# Patient Record
Sex: Female | Born: 1985 | Race: White | Hispanic: No | Marital: Single | Smoking: Former smoker
Health system: Southern US, Community
[De-identification: ages and names within clinical notes are randomized; demographics above are authoritative.]

## PROBLEM LIST (undated history)

## (undated) DIAGNOSIS — F4310 Post-traumatic stress disorder, unspecified: Secondary | ICD-10-CM

## (undated) DIAGNOSIS — F909 Attention-deficit hyperactivity disorder, unspecified type: Secondary | ICD-10-CM

## (undated) DIAGNOSIS — R2990 Unspecified symptoms and signs involving the nervous system: Secondary | ICD-10-CM

## (undated) DIAGNOSIS — G43909 Migraine, unspecified, not intractable, without status migrainosus: Secondary | ICD-10-CM

## (undated) DIAGNOSIS — F419 Anxiety disorder, unspecified: Secondary | ICD-10-CM

## (undated) DIAGNOSIS — I10 Essential (primary) hypertension: Secondary | ICD-10-CM

## (undated) DIAGNOSIS — K219 Gastro-esophageal reflux disease without esophagitis: Secondary | ICD-10-CM

## (undated) DIAGNOSIS — G8929 Other chronic pain: Secondary | ICD-10-CM

## (undated) DIAGNOSIS — F988 Other specified behavioral and emotional disorders with onset usually occurring in childhood and adolescence: Secondary | ICD-10-CM

## (undated) DIAGNOSIS — M5416 Radiculopathy, lumbar region: Secondary | ICD-10-CM

## (undated) HISTORY — DX: Gastro-esophageal reflux disease without esophagitis: K21.9

## (undated) HISTORY — PX: WISDOM TOOTH EXTRACTION: SHX21

## (undated) HISTORY — DX: Essential (primary) hypertension: I10

## (undated) HISTORY — DX: Post-traumatic stress disorder, unspecified: F43.10

## (undated) HISTORY — DX: Unspecified symptoms and signs involving the nervous system: R29.90

## (undated) HISTORY — DX: Radiculopathy, lumbar region: M54.16

## (undated) HISTORY — DX: Other specified behavioral and emotional disorders with onset usually occurring in childhood and adolescence: F98.8

## (undated) HISTORY — DX: Migraine, unspecified, not intractable, without status migrainosus: G43.909

## (undated) HISTORY — DX: Anxiety disorder, unspecified: F41.9

---
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
## 2009-11-22 ENCOUNTER — Emergency Department (HOSPITAL_COMMUNITY)
Admission: EM | Admit: 2009-11-22 | Discharge: 2009-11-22 | Payer: Self-pay | Source: Home / Self Care | Admitting: Family Medicine

## 2009-12-27 ENCOUNTER — Ambulatory Visit: Payer: Self-pay | Admitting: Oncology

## 2010-02-03 ENCOUNTER — Ambulatory Visit: Payer: Self-pay | Admitting: Oncology

## 2010-04-07 ENCOUNTER — Ambulatory Visit: Payer: Self-pay | Admitting: Oncology

## 2010-08-14 HISTORY — PX: TONSILLECTOMY: SUR1361

## 2010-09-03 ENCOUNTER — Encounter: Payer: Self-pay | Admitting: Family Medicine

## 2010-12-30 NOTE — Discharge Summary (Signed)
NAME:  Beth Holt, Beth Holt                       ACCOUNT NO.:  [id number]   MEDICAL RECORD NO.:  [id number]                   PATIENT TYPE:  INP   LOCATION:  0105                                 FACILITY:  [facility]   PHYSICIAN:  Beverly Milch, MD                  DATE OF BIRTH:  04/12/1986   DATE OF ADMISSION:  05/28/2003  DATE OF DISCHARGE:  06/01/2003                                 DISCHARGE SUMMARY   IDENTIFICATION:  This 25 year old female, ninth grade student at [employer], was admitted emergently by [facility] from [facility] Emergency Room, where the emergency room staff and ACT team had  responded to Dr. Tresea Mall referral for inpatient stabilization of  suicide risk with refusal to contract for safety.  The patient was highly  ambivalent and labile and presented obvious expectation that her maladaptive  ways be reinforced by whatever confinement was given.  The patient and  mother demanded release shortly after arrival because the patient did not  like the structure or containment.  For full details, please see the typed  history and physical.   HISTORY OF PRESENT ILLNESS:  Mother and patient gradually clarify that the  patient has been monitored and treated for ADHD symptoms intermittently much  of her academic life.  The patient has also been labile in relationships  with sometimes variant mannerisms.  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  However,  she subsequently encountered social conflicts such that mother is  considering home-schooling her again this year as she did last year.  The  patient reports a 20-pound weight gain but denies binge-eating or other such  origin.  She does not appear likely objectively to have gained 20 pounds,  particularly as she remains quite thin.  The patient has been on Prozac 40  mg daily and Strattera 80 mg daily but mother request that  medications be  discontinued at the time of admission and does discuss that Wellbutrin has  been considered as an alternative by Dr. Karie Georges.  The patient overdosed  October 8th or October 9th, apparently with Prozac and Naprosyn and did not  present with care until the day of admission to the emergency room.  She  reports numerous physical complaints such as hypoglycemia, causing her to  pass out with exercise or nearly do so, TMJ syndrome, irregular menses and  weight gain.  The patient continues to maintain that she is fine with  parental divorce when she was in late __________ years or early latency.  At  that time, there was significant financial hardships and mother had cancer  then.  Mother can outlined that the patient's social difficulties in school  as well as her tendency to hold everything inside and not talk out problem  resolution started then.   INITIAL MENTAL STATUS  EXAM:  The patient was labile with splitting,  distortion or denial without empathy or remorse.  She changes her mind every  few seconds but does not exhibit definite manic shifts even though she is  silly and regressive at times.  She does have atypical and hysteroid  dysphoric features.  She is moderately dysphoric, though she becomes  severely dysphoric for brief periods and at other times is smiling and  stating nothing is wrong but she does not elevate her mood beyond euthymia.  The patient reports hearing voices in a joking way but then states that it  is a ringing noise like a phone.  She would not be more clear.  She refuses  to contract for safety and continues to have suicidal ideation following her  suicide attempt.  She asked to be put to sleep.   LABORATORY DATA:  At [facility] Emergency Room, the CBC was normal except  white count low at 44,600 with reference range 4800-12,000.  Hemoglobin was  normal at 12.6, MCV at 86 and platelet count 307,000.  Urine pregnancy test  was negative.  Urine drug  screen was negative.  Basic metabolic panel was  normal with sodium 138, potassium 3.8, glucose 90, creatinine 0.7 and total  calcium 9.4.  No further laboratory testing was undertaken as the patient  and mother were demanding discharge initially at the time of arrival and  subsequently 12 hours later.   HOSPITAL COURSE AND TREATMENT:  Rest of step-wise intervention and  confrontation of the maladaptive problem-solving by the patient that mother  becomes overwhelmed with but also enables was undertaken.  Mother  participated at 8 a.m. the morning after admission for such an intervention.  By actively clarifying symptoms and maladaptive ways of sustaining them  rather than changing them, the patient and mother became able gradually to  allow the patient to remain in the treatment process.  Every effort was made  to not reinforce the patient's somewhat egosyntonic acting out and  destructive ways by the inclusion in the treatment program.  Such was  successful with patient and mother by the time of discharge, though the  patient had little insight into the process.  However, she was opening up  and talking more including with staff, peers and program as well as with  mother.  General medical evaluation by Vic Ripper, P.A.-C. noted the  TMJ, muscle spasm and headache symptoms and overdose last Saturday.  The  patient's Flexeril was not continued at this time and, during her hospital  stay, minocycline was not given particularly as the patient was ambivalent  about any medication.  The patient was started on Wellbutrin and received  150 mg XL every morning for the first three days.  On the day of discharge,  the 150 mg XL was given again with plan to titrate up to 300 mg the  following day.  The patient tolerated the medication well.  She had some  exaggerated mannerisms in periorbital phase that were not definite tics. Mother indicated that she had had such mannerisms, particularly  when talking  about stressful subjects all of her life.  She did not note exaggeration  with stimulants in the past so that no such exacerbation is necessarily  expected with Wellbutrin.  However, the patient and mother were educated on  this.  The patient's admission weight was 122.5 pounds with blood pressure  127/92 and heart rate 81.  The patient reported some hand-washing rituals in  the past.  Her weight at the time of discharge was 122 pounds with blood  pressure 122/75, heart rate 80, sitting, and blood pressure 119/83 with  heart rate of 100, standing.  The patient's suicidal ideation remitted and  she was able to establish contract for safety as well as crisis and safety  plans.  She participated in all aspects of active inpatient treatment,  though her insight remained marginal and she was still prone to validating  her maladaptive symptoms as though somewhat ego dystonic.  However, she was  able to address her sense of identity diffusion and confusion and the  origins for such in early life.  She did not manifest definite pervasive  developmental disorder and a personality disorder could not be consolidated  diagnostically at this time, though she is at significant risk for such.  Mother was able to identify a journal of a 23 year old female peer at school  who may have been stressing the patient by having parents that did not care  about him and having such morbid preoccupations and drawings.  The patient  and mother worked through these associations and the expectation that the  patient disengage.  The patient and mother were capable of participating in  therapy by the time of discharge for ongoing aftercare.  She is safe for  discharge and continued titration of the Wellbutrin to the targeted dose of  300 mg XL daily.   FINAL DIAGNOSES:   AXIS I:  1. 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  2. 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  3. History of attention-deficit hyperactivity disorder, predominantly     inattentive-type.  4. Parent-child problem.  5. Other specified family circumstances.  6. Other interpersonal problems.   AXIS II:  Rule out personality disorder not otherwise specified (provisional  diagnosis).   AXIS III:  1. History of hypoglycemia.  2. Temporomandibular joint syndrome.  3. Irregular menses.  4. Borderline glucopenia.   AXIS IV:  Stressors:  Family--severe, acute and chronic; school, phase of  life and peer relations--moderate, predominantly acute and chronic.   AXIS V:  Global Assessment of Functioning at the time of admission 40; with  highest in the last year 74 and discharge Global Assessment of Functioning  54.   PLAN:  The patient was discharged to mother in improved condition.  She is  more capable of participating in aftercare therapies.  She remains a characterologic risk for continued conflicts and consequences, though she is  encouraged to remain in public school and to work out the problems step by  step rather than withdrawing.  The patient is educated on medications as is  mother.  She is prescribed Wellbutrin XL every morning; quantity #30 with  one refill prescribed.  Strattera and Prozac have been discontinued.  She  will see Dr. Kieth Brightly June 03, 2003 at 15:00 and Dr. Minerva Areola July 01, 2003 at 14:15 at [facility] in [place].  Crisis and safety  plans are established if needed.                                                Beverly Milch, MD    GJ/MEDQ  D:  06/01/2003  T:  06/02/2003  Job:  [id number]   cc:   [facility]  [id number]  [place], [place] [address]

## 2010-12-30 NOTE — H&P (Signed)
NAME:  Beth Holt, Beth Holt                       ACCOUNT NO.:  [id number]   MEDICAL RECORD NO.:  [id number]                   PATIENT TYPE:  INP   LOCATION:  0105                                 FACILITY:  [facility]   PHYSICIAN:  Beverly Milch, MD                  DATE OF BIRTH:  12-04-1985   DATE OF ADMISSION:  05/28/2003  DATE OF DISCHARGE:                         PSYCHIATRIC ADMISSION ASSESSMENT   PATIENT IDENTIFICATION:  This 25 year old female ninth grade student at  [employer] is admitted emergently in transfer from [facility] Emergency Room for inpatient stabilization of suicide risk and  depression.  The patient presented to the emergency room in a highly  confusing fashion, eliciting from the staff the sense that she is helpless  and should be fully believed and taken care of while making statements that  are contradictory in a splitting fashion.  As an example, the patient asked  the emergency room staff to put her to sleep to kill her.  She would  contract for safety and stated that she had overdosed May 23, 2003.  The  patient had recently been on Prozac 40 mg daily and Strattera 80 mg daily  from [facility]. Halm, D.O., and Dr. Milford Cage referred her to the emergency room  stating that with her symptoms continuing to multiply and undergo  morphological changes that she will need specialized care.  After arrival to  the [facility], the patient demands to be discharged and  mother initially responds in an enabling fashion.  Mother clarifies that  their relationship is built around losses mutually experienced when parents  divorced, there were severe financial limitations, and mother had cancer in  the late oedipal and early latency years for the patient.  Mother states  that life is better now but the patient is not as though she is continuing  to grieve or rework these past losses in a destructive way rather than a  successful way.   HISTORY OF  PRESENT ILLNESS:  Mother suggests that the patient was somewhat  unusual including even in early elementary school.  She indicates the  patient never would get close to teachers to talk in ways that would allow  them to help her.  The patient had difficulty academically in school as well  as socially.  However, the patient did not come to profession attention for  her difficulties apparently until several months ago.  Mother suggests that  for the last three months, the patient has been much more destructive in her  acting out and in her interpersonal conflicts.  The patient has been defiant  of mother's rules; for instance, having a group of female friends take her to  grandmother's house instead of mother's house such that mother was very  worried about her.  The patient seems to be becoming much more somatic.  She  states she has TMJ syndrome, which needs  Naprosyn and Flexeril and plans to  see an oral surgeon next week while she is also having hypoglycemia and is  tired all the time.  The patient reports a weight gain of 20 pounds but  denies binge eating or purging.  She reports sleeping eight to 10 hours  nightly.  She has had several menses which were late or did not occur at all  this summer.  The patient presents repeated psychiatric complaints as well  such as hearing voices and having mood swings.  The patient does not seem to  reply consistently in her descriptions but rather seems to change  frequently.  Mother notes that the patient never will get to the bottom of  their conflicts and seems to have things she is depressed about at times  that mother cannot understand or the patient will not say.  Mother has  stopped the patient's medications, having learned that the patient overdosed  October 8 or May 23, 2003, apparently with Prozac and Naprosyn.  The  patient was medically cleared at [facility] Room.  The evaluating  therapist in the emergency room had worked with  me to clarify that the  patient's splitting and distortion in treatment would be better served in  outpatient treatment if she would contract for safety.  The patient would  not contract for safety and was sent to the [facility] by [facility].  The patient's overdose was unwitnessed and no additional confirmation  is possible with the patient not telling anyone for several days.  The  patient concludes that her head changes day to day as she avoids resolving  immediate or past conflict by generating displaced conflict.  The patient  continues to do so about suicidal ideation, clarifying when she states she  wants to go home that has no suicidal ideation and that she has resolved  these playful problems while at other times stating that she is suicidal.   PAST MEDICAL HISTORY:  The patient reportedly has hypoglycemia and TMJ  syndrome.  Her blood pressure was elevated at the time of admission at  127/92.  The patient reports that her menses have been irregular.  She does  not acknowledge sexual activity.  She suggests she has some repetitive hand  washing at times.  She does report feeling tired all the time.  She suggests  she has gained 20 pounds but she looks thin.  The patient reports that her  skin is sensitive and that she is near sighted.  The patient denies seizure  or syncope.  She denies heart murmur or arrhythmia.  She has had no known  organic central nervous system trauma.  She does not use drugs or alcohol.  She has no medication allergies.   REVIEW OF SYSTEMS:  The patient denies difficulty with gait, gaze, or  countenance.  She denies exposure to communicable disease or toxins.  She  denies rash, jaundice, or purpura.  She denies chest pain, palpitations, or  presyncope.  She denies abdominal pain, nausea, vomiting, or diarrhea.  She  has no dysuria or arthralgia.  At [facility] ER, she had a CBC, urine pregnancy test, urine drug screen, basic metabolic panel,  and blood alcohol  level, all of which were negative.   Immunizations are up-to-date through [facility]. Halm, D.O.   PHYSICAL EXAMINATION:  VITAL SIGNS:  Weight is 122.5 pounds and height is 65  inches with blood pressure 127/92 and heart rate 81.  NEUROLOGIC:  The patient is alert and oriented with speech intact.  Reflexes  and AMRs are 0/0.  There are no abnormal involuntarily movements.  There are  no neurologic soft signs.  Gait and gaze are intact.   SOCIAL AND DEVELOPMENTAL HISTORY:  Mother suggests the patient has had  difficulty since starting school.  She suggests the patient has always been  hesitant to open up and talk about her problems.  Mother does not describe  eccentric or odd mannerisms or relatedness but rather the patient was  avoiding the approach of others.  Mother perceives that the patient did  improve over time, becoming able to do her own work in school academically  and being more socially involved.  The patient was a cheerleader apparently  but she was being teased significantly by others including practical jokes.  Mother held her out of school for six months of the last school years.  Mother has now returned her to public school.  The patient is now  decompensating in other ways.  Mother seems to describe that the patient  does have the capacity for social relatedness and socialization.  She does  not outline a definite PDD or attachment disorder.  However, she does  describe unresolved intrapsychic conflicts that the patient will not discuss  with either mother or myself today.  After parents divorced, they had  significant financial problems.  Mother then had cancer as well.  Although  mother is doing better now and states the patient has what she needs and  plenty of support, the patient is starting to worry about other things.  The  patient does not acknowledge using cigarettes, alcohol, or illicit drugs.  She does not acknowledge sexual activity.    FAMILY HISTORY:  The patient and mother do not provide much elaboration on  the cause, course, or consequences of parental divorce.  The patient will  always say to mother that she is fine with the divorce though mother  suspects the patient has been hurt in some way in the past.  Mother denies  that there is any family history of major psychiatric disorder but  acknowledges that father is currently somewhat uninvolved in the patient's  current daily problems.   MENTAL STATUS EXAM:  The patient is labile and addresses confrontation by  splitting, distortion, and denial without empathy or remorse.  The patient  will gradually acknowledge that she is angry, especially about mother now  requiring treatment when she does not want it.  The patient thinks that she  should decide but she changes her mind every few seconds.  The patient is  highly inconsistent.  Mother states the patient is inattentive from ADHD and apparently this did not become fully apparent until late junior high.  The  patient is irritable and controlling.  She is moderately dysphoric, becoming  severely dysphoric at times though at other times, she smiles and states  that nothing is wrong.  The patient states she hears voices in a joking way.  She hears a ringing noise at times that sounds like a phone but she tries to  answer it and cannot find it.  Misperceptions are not of the psychotic type.  She is not homicidal or assaultive.  However, she has reported a suicide  attempt as well as reporting unwillingness to contract for safety.  She  expects others to just accept this but intervention with the patient and  mother can establish in mother the ability to state that dying  is  unacceptable.   ADMISSION DIAGNOSES:   AXIS I:  1. 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  2. 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  3. History of attention-deficit hyperactivity  disorder, predominantly     inattentive type.  4. Parent-child problem.  5. Other interpersonal problems.  6. Other specified family circumstances.   AXIS II:  Diagnosis deferred.   AXIS III:  1. History of hypoglycemia.  2. History of temporomandibular joint function.  3. Irregular menses.   AXIS IV:  Stressors: Family- severe, predominantly acute and chronic;  school, phase of life, and peer relations- moderate, predominantly acute and  chronic.   AXIS V:  Current global assessment of functioning 40 with highest global  assessment of functioning in the last year 74.   ASSETS AND STRENGTHS:  The patient is capable of doing well in school, at  least for intervals of time.   INITIAL PLAN OF CARE:  The patient and mother agree to three to four days of  inpatient treatment.  We structure that keeping suicide risk is not an  acceptable answer and that the patient will have to work out some accurate  understanding of her problems and some pragmatic solutions including to stay  alive and to cope.  Character disorder is certainly suspect and the patient  presents some symptoms egocentonic but then will cry and decompensate in a  depressive fashion.  She and mother request treatment for a chemical  imbalance and establish that Francoise Schaumann. Halm, D.O., had considered Wellbutrin  in place of Strattera and Prozac even at the time of his referral to the  emergency room.  Cognitive behavioral and family therapy are undertaken.  Containment of splitting, distortion, and denial are essential for progress  and treatment to begin.   ESTIMATED LENGTH OF STAY:  Three to four days.   CONDITIONS NECESSARY FOR DISCHARGE:  Target symptoms for discharge include  stabilization of suicide risk, stabilization of mood, and commitment to  resolving instead of sustaining and exacerbating her current maladaptive  coping and problems.                                               Beverly Milch,  MD   GJ/MEDQ  D:  05/29/2003  T:  05/29/2003  Job:  [id number]

## 2011-01-24 ENCOUNTER — Other Ambulatory Visit (HOSPITAL_COMMUNITY): Payer: Self-pay | Admitting: Family Medicine

## 2011-01-24 DIAGNOSIS — K7689 Other specified diseases of liver: Secondary | ICD-10-CM

## 2011-01-24 DIAGNOSIS — R932 Abnormal findings on diagnostic imaging of liver and biliary tract: Secondary | ICD-10-CM

## 2011-01-25 ENCOUNTER — Other Ambulatory Visit (HOSPITAL_COMMUNITY): Payer: Self-pay | Admitting: *Deleted

## 2011-01-25 ENCOUNTER — Ambulatory Visit (HOSPITAL_COMMUNITY)
Admission: RE | Admit: 2011-01-25 | Discharge: 2011-01-25 | Disposition: A | Discharge status: Home / Self Care | Payer: BC Managed Care – PPO | Source: Ambulatory Visit | Attending: Family Medicine | Admitting: Family Medicine

## 2011-01-25 ENCOUNTER — Other Ambulatory Visit (HOSPITAL_COMMUNITY): Payer: Self-pay | Admitting: Obstetrics & Gynecology

## 2011-01-25 DIAGNOSIS — Z139 Encounter for screening, unspecified: Secondary | ICD-10-CM

## 2011-01-25 DIAGNOSIS — R932 Abnormal findings on diagnostic imaging of liver and biliary tract: Secondary | ICD-10-CM

## 2011-01-25 DIAGNOSIS — R109 Unspecified abdominal pain: Secondary | ICD-10-CM | POA: Insufficient documentation

## 2011-01-25 DIAGNOSIS — Z1231 Encounter for screening mammogram for malignant neoplasm of breast: Secondary | ICD-10-CM | POA: Insufficient documentation

## 2011-01-25 DIAGNOSIS — K7689 Other specified diseases of liver: Secondary | ICD-10-CM

## 2011-02-14 ENCOUNTER — Encounter: Payer: Self-pay | Admitting: Gastroenterology

## 2011-12-29 ENCOUNTER — Other Ambulatory Visit (HOSPITAL_COMMUNITY): Payer: Self-pay | Admitting: Family Medicine

## 2011-12-29 DIAGNOSIS — Z139 Encounter for screening, unspecified: Secondary | ICD-10-CM

## 2012-01-29 ENCOUNTER — Ambulatory Visit (HOSPITAL_COMMUNITY)
Admission: RE | Admit: 2012-01-29 | Discharge: 2012-01-29 | Disposition: A | Discharge status: Home / Self Care | Payer: Managed Care, Other (non HMO) | Source: Ambulatory Visit | Attending: Family Medicine | Admitting: Family Medicine

## 2012-01-29 DIAGNOSIS — Z1231 Encounter for screening mammogram for malignant neoplasm of breast: Secondary | ICD-10-CM | POA: Insufficient documentation

## 2012-01-29 DIAGNOSIS — Z139 Encounter for screening, unspecified: Secondary | ICD-10-CM

## 2012-10-28 ENCOUNTER — Ambulatory Visit (INDEPENDENT_AMBULATORY_CARE_PROVIDER_SITE_OTHER): Payer: Managed Care, Other (non HMO) | Admitting: Gastroenterology

## 2012-10-28 ENCOUNTER — Other Ambulatory Visit: Payer: Self-pay | Admitting: Gastroenterology

## 2012-10-28 ENCOUNTER — Encounter: Payer: Self-pay | Admitting: Gastroenterology

## 2012-10-28 VITALS — BP 120/73 | HR 83 | Temp 98.4°F | Ht 67.0 in | Wt 132.8 lb

## 2012-10-28 DIAGNOSIS — R141 Gas pain: Secondary | ICD-10-CM

## 2012-10-28 DIAGNOSIS — R197 Diarrhea, unspecified: Secondary | ICD-10-CM

## 2012-10-28 DIAGNOSIS — K219 Gastro-esophageal reflux disease without esophagitis: Secondary | ICD-10-CM

## 2012-10-28 DIAGNOSIS — R140 Abdominal distension (gaseous): Secondary | ICD-10-CM | POA: Insufficient documentation

## 2012-10-28 LAB — CBC WITH DIFFERENTIAL/PLATELET
Basophils Absolute: 0.1 10*3/uL (ref 0.0–0.1)
Basophils Relative: 2 % — ABNORMAL HIGH (ref 0–1)
Eosinophils Absolute: 0.3 10*3/uL (ref 0.0–0.7)
Eosinophils Relative: 6 % — ABNORMAL HIGH (ref 0–5)
HCT: 41.7 % (ref 36.0–46.0)
Hemoglobin: 13.9 g/dL (ref 12.0–15.0)
Lymphocytes Relative: 31 % (ref 12–46)
Lymphs Abs: 1.6 10*3/uL (ref 0.7–4.0)
MCH: 27.5 pg (ref 26.0–34.0)
MCHC: 33.3 g/dL (ref 30.0–36.0)
MCV: 82.6 fL (ref 78.0–100.0)
Monocytes Absolute: 0.5 10*3/uL (ref 0.1–1.0)
Monocytes Relative: 9 % (ref 3–12)
Neutro Abs: 2.7 10*3/uL (ref 1.7–7.7)
Neutrophils Relative %: 52 % (ref 43–77)
Platelets: 318 10*3/uL (ref 150–400)
RBC: 5.05 MIL/uL (ref 3.87–5.11)
RDW: 13.6 % (ref 11.5–15.5)
WBC: 5.2 10*3/uL (ref 4.0–10.5)

## 2012-10-28 MED ORDER — PEG 3350-KCL-NA BICARB-NACL 420 G PO SOLR: 4000.0000 mL | ORAL | Status: DC

## 2012-10-28 MED ORDER — PANTOPRAZOLE SODIUM 40 MG PO TBEC: 40.0000 mg | DELAYED_RELEASE_TABLET | Freq: Every day | ORAL | Status: DC

## 2012-10-28 NOTE — Assessment & Plan Note (Signed)
Start Protonix daily. Prescription provided. EGD due to melena.

## 2012-10-28 NOTE — Assessment & Plan Note (Signed)
Associated with eating, chronic. Celiac serologies as planned. TCS/EGD.

## 2012-10-28 NOTE — Patient Instructions (Addendum)
Please have blood work completed.   Start taking Protonix (for reflux) each morning 30 minutes before breakfast.  We have given you samples of Restora, which is a probiotic. Take this daily. You can also find other brands such as Align, Digestive Advantage, [employer]'s Colon Health.  We have scheduled you for a colonoscopy and upper endoscopy with Dr. Jena Gauss in the near future. Further recommendations to follow.

## 2012-10-28 NOTE — Progress Notes (Signed)
Primary Care Physician:  Morrow, Aaron P, MD Primary Gastroenterologist:  Dr. Rourk   Chief Complaint  Patient presents with  . Diarrhea  . Bloated    hurts in lower back  . Gas    HPI:   [name] is a very pleasant 27-year-old female who presents today as a self-referral. She notes a history of lactose intolerance and GI problems since childhood. States she bloats severely with eating, "looks 5 months pregnant". Gas is hard to control. At times has noted fecal incontinence.   Notes tarry stools, bright red blood. Noted about a month ago, 3 separate occasions. Stools can be very mucus like. Discharge-like. Notes loose stools every time she pees. 5-7 bowel movements a day. Sometimes has the urge but can't go. Diarrhea first thing in morning. Notes lower back feels pressure/pain/constant. No regular bowel movements.  +abdominal pain with bloating. States abdomen will be very distended. No N/V. No NSAIDs or aspirin powders. Last year woke up choking from nocturnal reflux. Happened 5-6 times. Notes heartburn intermittently. Will burp it up then swallow.   Tried to follow a gluten-free diet and noticed improvement. +postprandial component. Denies significant weight loss. States weight fluctuates often.   Past Medical History  Diagnosis Date  . GERD (gastroesophageal reflux disease)   . ADD (attention deficit disorder)   . Migraines   . Hypertension   . Anxiety   . PTSD (post-traumatic stress disorder)     Past Surgical History  Procedure Laterality Date  . Tonsillectomy  2012  . Wisdom tooth extraction      Current Outpatient Prescriptions  Medication Sig Dispense Refill  . ADDERALL XR 20 MG 24 hr capsule Take 20 mg by mouth every morning.       . norgestimate-ethinyl estradiol (ORTHO-CYCLEN,SPRINTEC,PREVIFEM) 0.25-35 MG-MCG tablet Take 1 tablet by mouth daily.       . propranolol (INDERAL) 10 MG tablet Take 10 mg by mouth 2 (two) times daily.       . sertraline  (ZOLOFT) 100 MG tablet Take 100 mg by mouth daily.       . topiramate (TOPAMAX) 50 MG tablet Take 50 mg by mouth daily.       . pantoprazole (PROTONIX) 40 MG tablet Take 1 tablet (40 mg total) by mouth daily.  30 tablet  3  . polyethylene glycol-electrolytes (TRILYTE) 420 G solution Take 4,000 mLs by mouth as directed.  4000 mL  0   No current facility-administered medications for this visit.    Allergies as of 10/28/2012 - Review Complete 10/28/2012  Allergen Reaction Noted  . Lactose intolerance (gi)  10/28/2012    Family History  Problem Relation Age of Onset  . Colon cancer Neg Hx   . Breast cancer Mother     History   Social History  . Marital Status: Single    Spouse Name: N/A    Number of Children: N/A  . 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 Smoking status: Current Every Day Smoker -- 0.50 packs/day for 5 years  . Smokeless tobacco: Not on file  . Alcohol Use: No  . Drug Use: No  . Sexually Active: Yes    Birth Control/ Protection: OCP   Other Topics Concern  . Not on file   Social History Narrative  . No narrative on file    Review of Systems: Gen: Denies any fever, chills,   fatigue, weight loss, lack of appetite.  CV: Denies chest pain, heart palpitations, peripheral edema, syncope.   Resp: Denies shortness of breath at rest or with exertion. Denies wheezing or cough.  GI: SEE HPI GU : Denies urinary burning, urinary frequency, urinary hesitancy MS: Denies joint pain, muscle weakness, cramps, or limitation of movement.  Derm: Denies rash, itching, dry skin Psych: Denies depression, anxiety, memory loss, and confusion Heme: Denies bruising, bleeding, and enlarged lymph nodes.  Physical Exam: BP 120/73  Pulse 83  Temp(Src) 98.4 F (36.9 C) (Oral)  Ht 5' 7" (1.702 m)  Wt 132 lb 12.8 oz (60.238 kg)  BMI 20.79 kg/m2  LMP 10/22/2012 General:   Alert and oriented. Pleasant and  cooperative. Well-nourished and well-developed.  Head:  Normocephalic and atraumatic. Eyes:  Without icterus, sclera clear and conjunctiva pink.  Ears:  Normal auditory acuity. Nose:  No deformity, discharge,  or lesions. Mouth:  No deformity or lesions, oral mucosa pink.  Neck:  Supple, without mass or thyromegaly. Lungs:  Clear to auscultation bilaterally. No wheezes, rales, or rhonchi. No distress.  Heart:  S1, S2 present without murmurs appreciated.  Abdomen:  +BS, soft, TTP lower abdomen and non-distended. No HSM noted. No guarding or rebound. No masses appreciated.  Rectal:  Deferred  Msk:  Symmetrical without gross deformities. Normal posture. Extremities:  Without clubbing or edema. Neurologic:  Alert and  oriented x4;  grossly normal neurologically. Skin:  Intact without significant lesions or rashes. Cervical Nodes:  No significant cervical adenopathy. Psych:  Alert and cooperative. Normal mood and affect.    

## 2012-10-28 NOTE — Assessment & Plan Note (Signed)
27 year old female with a history of chronic loose stools, abdominal bloating, occasional incontinence. Notes loose stools every time she urinates. Scant hematochezia noted on several prior episodes; however, she also reports melena recently. Mucus-like discharge noted. Postprandial component as well, with abdominal discomfort. No lower GI evaluation has been undertaken before. She notes some improvement with a trial of gluten-free diet. Concern for celiac disease, IBD, IBS. She desires to hold off on trial of Bentyl currently.   Proceed with TCS/EGD with Dr. Jena Gauss in near future: the risks, benefits, and alternatives have been discussed with the patient in detail. The patient states understanding and desires to proceed. TTG, IgA and IgA TSH CBC

## 2012-10-29 LAB — IGA: IgA: 108 mg/dL (ref 69–380)

## 2012-10-29 LAB — TSH: TSH: 1.169 u[IU]/mL (ref 0.350–4.500)

## 2012-10-29 LAB — TISSUE TRANSGLUTAMINASE, IGA: Tissue Transglutaminase Ab, IgA: 3 U/mL (ref <20)

## 2012-10-30 ENCOUNTER — Encounter (HOSPITAL_COMMUNITY): Payer: Self-pay | Admitting: Pharmacy Technician

## 2012-10-30 ENCOUNTER — Telehealth: Payer: Self-pay | Admitting: Gastroenterology

## 2012-10-30 LAB — HCG, SERUM, QUALITATIVE: Preg, Serum: NEGATIVE

## 2012-10-30 NOTE — Telephone Encounter (Signed)
Called [facility], added HCG qualitative- (per Micca this gives the positive/negative results)

## 2012-10-30 NOTE — Progress Notes (Signed)
Faxed to PCP

## 2012-10-30 NOTE — Telephone Encounter (Signed)
Can we add on a pregnancy test to blood already in lab?  If not, needs urine pregnancy test. Procedure tomorrow.

## 2012-10-30 NOTE — Telephone Encounter (Signed)
Noted. Thanks.

## 2012-10-31 ENCOUNTER — Encounter (HOSPITAL_COMMUNITY): Payer: Self-pay | Admitting: *Deleted

## 2012-10-31 ENCOUNTER — Encounter (HOSPITAL_COMMUNITY)
Admission: RE | Disposition: A | Discharge status: Home / Self Care | Payer: Self-pay | Source: Ambulatory Visit | Attending: Internal Medicine

## 2012-10-31 ENCOUNTER — Ambulatory Visit (HOSPITAL_COMMUNITY)
Admission: RE | Admit: 2012-10-31 | Discharge: 2012-10-31 | Disposition: A | Discharge status: Home / Self Care | Payer: Managed Care, Other (non HMO) | Source: Ambulatory Visit | Attending: Internal Medicine | Admitting: Internal Medicine

## 2012-10-31 DIAGNOSIS — R141 Gas pain: Secondary | ICD-10-CM

## 2012-10-31 DIAGNOSIS — R143 Flatulence: Secondary | ICD-10-CM

## 2012-10-31 DIAGNOSIS — F17200 Nicotine dependence, unspecified, uncomplicated: Secondary | ICD-10-CM | POA: Insufficient documentation

## 2012-10-31 DIAGNOSIS — R142 Eructation: Secondary | ICD-10-CM

## 2012-10-31 DIAGNOSIS — G43909 Migraine, unspecified, not intractable, without status migrainosus: Secondary | ICD-10-CM | POA: Insufficient documentation

## 2012-10-31 DIAGNOSIS — K219 Gastro-esophageal reflux disease without esophagitis: Secondary | ICD-10-CM

## 2012-10-31 DIAGNOSIS — R140 Abdominal distension (gaseous): Secondary | ICD-10-CM

## 2012-10-31 DIAGNOSIS — F988 Other specified behavioral and emotional disorders with onset usually occurring in childhood and adolescence: Secondary | ICD-10-CM | POA: Insufficient documentation

## 2012-10-31 DIAGNOSIS — E739 Lactose intolerance, unspecified: Secondary | ICD-10-CM | POA: Insufficient documentation

## 2012-10-31 DIAGNOSIS — R197 Diarrhea, unspecified: Secondary | ICD-10-CM | POA: Insufficient documentation

## 2012-10-31 DIAGNOSIS — F4310 Post-traumatic stress disorder, unspecified: Secondary | ICD-10-CM | POA: Insufficient documentation

## 2012-10-31 DIAGNOSIS — Z79899 Other long term (current) drug therapy: Secondary | ICD-10-CM | POA: Insufficient documentation

## 2012-10-31 DIAGNOSIS — D721 Eosinophilia, unspecified: Secondary | ICD-10-CM

## 2012-10-31 DIAGNOSIS — I10 Essential (primary) hypertension: Secondary | ICD-10-CM | POA: Insufficient documentation

## 2012-10-31 HISTORY — PX: COLONOSCOPY WITH ESOPHAGOGASTRODUODENOSCOPY (EGD): SHX5779

## 2012-10-31 LAB — CLOSTRIDIUM DIFFICILE BY PCR: Toxigenic C. Difficile by PCR: NEGATIVE

## 2012-10-31 SURGERY — COLONOSCOPY WITH ESOPHAGOGASTRODUODENOSCOPY (EGD)
Anesthesia: Moderate Sedation

## 2012-10-31 MED ORDER — ONDANSETRON HCL 4 MG/2ML IJ SOLN
INTRAMUSCULAR | Status: DC | PRN
  Administered 2012-10-31: 4 mg via INTRAVENOUS

## 2012-10-31 MED ORDER — BUTAMBEN-TETRACAINE-BENZOCAINE 2-2-14 % EX AERO
INHALATION_SPRAY | CUTANEOUS | Status: DC | PRN
  Administered 2012-10-31: 2 via TOPICAL

## 2012-10-31 MED ORDER — ONDANSETRON HCL 4 MG/2ML IJ SOLN
INTRAMUSCULAR | Status: AC
  Filled 2012-10-31: qty 2

## 2012-10-31 MED ORDER — MIDAZOLAM HCL 5 MG/5ML IJ SOLN
INTRAMUSCULAR | Status: AC
  Filled 2012-10-31: qty 10

## 2012-10-31 MED ORDER — MEPERIDINE HCL 100 MG/ML IJ SOLN
INTRAMUSCULAR | Status: DC | PRN
  Administered 2012-10-31: 50 mg via INTRAVENOUS
  Administered 2012-10-31 (×2): 25 mg via INTRAVENOUS

## 2012-10-31 MED ORDER — MIDAZOLAM HCL 5 MG/5ML IJ SOLN
INTRAMUSCULAR | Status: DC | PRN
  Administered 2012-10-31: 1 mg via INTRAVENOUS
  Administered 2012-10-31 (×3): 2 mg via INTRAVENOUS

## 2012-10-31 MED ORDER — STERILE WATER FOR IRRIGATION IR SOLN
Status: DC | PRN
  Administered 2012-10-31: 10:00:00

## 2012-10-31 MED ORDER — SODIUM CHLORIDE 0.9 % IV SOLN
INTRAVENOUS | Status: DC
  Administered 2012-10-31: 1000 mL via INTRAVENOUS

## 2012-10-31 MED ORDER — MEPERIDINE HCL 100 MG/ML IJ SOLN
INTRAMUSCULAR | Status: AC
  Filled 2012-10-31: qty 2

## 2012-10-31 NOTE — Progress Notes (Signed)
Quick Note:  Negative pregnancy test.  Proceed with procedure as planned. ______

## 2012-10-31 NOTE — Op Note (Signed)
[facility] [address] [place], [address]   ENDOSCOPY PROCEDURE REPORT  PATIENT: Beth, Holt  MR#: [id number] BIRTHDATE: 1986/05/07 , 26  yrs. old GENDER: Female ENDOSCOPIST: R.  Roetta Sessions, MD FACP FACG REFERRED BY:  Darlina Rumpf, M.D. PROCEDURE DATE:  10/31/2012 PROCEDURE:     EGD with gastric and duodenal biopsy  INDICATIONS:     Bloating/diarrhea/elevated peripheral eosinophil count  INFORMED CONSENT:   The risks, benefits, limitations, alternatives and imponderables have been discussed.  The potential for biopsy, esophogeal dilation, etc. have also been reviewed.  Questions have been answered.  All parties agreeable.  Please see the history and physical in the medical record for more information.  MEDICATIONS:    Versed 5 mg IV and Demerol 100 mg IV in divided doses. Zofran 4 mg IV. Cetacaine spray.  DESCRIPTION OF PROCEDURE:   The Pentax Gastroscope [id number] endoscope was introduced through the mouth and advanced to the second portion of the duodenum without difficulty or limitations. The mucosal surfaces were surveyed very carefully during advancement of the scope and upon withdrawal.  Retroflexion view of the proximal stomach and esophagogastric junction was performed.      FINDINGS: Normal esophagus. Stomach empty. Normal-appearing gastric mucosa. Patent pylorus. Normal-appearing first second and third portion of the duodenum.  THERAPEUTIC / DIAGNOSTIC MANEUVERS PERFORMED:  Biopsies of the duodenal and gastric mucosa taken.   COMPLICATIONS:  None  IMPRESSION: Normal Esophagus, stomach and duodenum through the third portion-status post biopsy and described above.  RECOMMENDATIONS:  Followup on pathology. See colonoscopy report.    _______________________________ R. Roetta Sessions, MD FACP [facility] eSigned:  R. Roetta Sessions, MD FACP [facility] 10/31/2012 10:56 AM     CC:

## 2012-10-31 NOTE — H&P (View-Only) (Signed)
Primary Care Physician:  Laurell Josephs, MD Primary Gastroenterologist:  Dr. Jena Gauss   Chief Complaint  Patient presents with  . Diarrhea  . Bloated    hurts in lower back  . Gas    HPI:   [name] is a very pleasant 27 year old female who presents today as a self-referral. She notes a history of lactose intolerance and GI problems since childhood. States she bloats severely with eating, "looks 5 months pregnant". Gas is hard to control. At times has noted fecal incontinence.   Notes tarry stools, bright red blood. Noted about a month ago, 3 separate occasions. Stools can be very mucus like. Discharge-like. Notes loose stools every time she pees. 5-7 bowel movements a day. Sometimes has the urge but can't go. Diarrhea first thing in morning. Notes lower back feels pressure/pain/constant. No regular bowel movements.  +abdominal pain with bloating. States abdomen will be very distended. No N/V. No NSAIDs or aspirin powders. Last year woke up choking from nocturnal reflux. Happened 5-6 times. Notes heartburn intermittently. Will burp it up then swallow.   Tried to follow a gluten-free diet and noticed improvement. +postprandial component. Denies significant weight loss. States weight fluctuates often.   Past Medical History  Diagnosis Date  . GERD (gastroesophageal reflux disease)   . ADD (attention deficit disorder)   . Migraines   . Hypertension   . Anxiety   . PTSD (post-traumatic stress disorder)     Past Surgical History  Procedure Laterality Date  . Tonsillectomy  2012  . Wisdom tooth extraction      Current Outpatient Prescriptions  Medication Sig Dispense Refill  . ADDERALL XR 20 MG 24 hr capsule Take 20 mg by mouth every morning.       . norgestimate-ethinyl estradiol (ORTHO-CYCLEN,SPRINTEC,PREVIFEM) 0.25-35 MG-MCG tablet Take 1 tablet by mouth daily.       . propranolol (INDERAL) 10 MG tablet Take 10 mg by mouth 2 (two) times daily.       . sertraline  (ZOLOFT) 100 MG tablet Take 100 mg by mouth daily.       Marland Kitchen topiramate (TOPAMAX) 50 MG tablet Take 50 mg by mouth daily.       . pantoprazole (PROTONIX) 40 MG tablet Take 1 tablet (40 mg total) by mouth daily.  30 tablet  3  . polyethylene glycol-electrolytes (TRILYTE) 420 G solution Take 4,000 mLs by mouth as directed.  4000 mL  0   No current facility-administered medications for this visit.    Allergies as of 10/28/2012 - Review Complete 10/28/2012  Allergen Reaction Noted  . Lactose intolerance (gi)  10/28/2012    Family History  Problem Relation Age of Onset  . Colon cancer Neg Hx   . Breast cancer Mother     History   Social History  . Marital Status: Single    Spouse Name: N/A    Number of Children: N/A  . 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 Smoking status: Current Every Day Smoker -- 0.50 packs/day for 5 years  . Smokeless tobacco: Not on file  . Alcohol Use: No  . Drug Use: No  . Sexually Active: Yes    Birth Control/ Protection: OCP   Other Topics Concern  . Not on file   Social History Narrative  . No narrative on file    Review of Systems: Gen: Denies any fever, chills,  fatigue, weight loss, lack of appetite.  CV: Denies chest pain, heart palpitations, peripheral edema, syncope.   Resp: Denies shortness of breath at rest or with exertion. Denies wheezing or cough.  GI: SEE HPI GU : Denies urinary burning, urinary frequency, urinary hesitancy MS: Denies joint pain, muscle weakness, cramps, or limitation of movement.  Derm: Denies rash, itching, dry skin Psych: Denies depression, anxiety, memory loss, and confusion Heme: Denies bruising, bleeding, and enlarged lymph nodes.  Physical Exam: BP 120/73  Pulse 83  Temp(Src) 98.4 F (36.9 C) (Oral)  Ht 5\' 7"  (1.702 m)  Wt 132 lb 12.8 oz (60.238 kg)  BMI 20.79 kg/m2  LMP 10/22/2012 General:   Alert and oriented. Pleasant and  cooperative. Well-nourished and well-developed.  Head:  Normocephalic and atraumatic. Eyes:  Without icterus, sclera clear and conjunctiva pink.  Ears:  Normal auditory acuity. Nose:  No deformity, discharge,  or lesions. Mouth:  No deformity or lesions, oral mucosa pink.  Neck:  Supple, without mass or thyromegaly. Lungs:  Clear to auscultation bilaterally. No wheezes, rales, or rhonchi. No distress.  Heart:  S1, S2 present without murmurs appreciated.  Abdomen:  +BS, soft, TTP lower abdomen and non-distended. No HSM noted. No guarding or rebound. No masses appreciated.  Rectal:  Deferred  Msk:  Symmetrical without gross deformities. Normal posture. Extremities:  Without clubbing or edema. Neurologic:  Alert and  oriented x4;  grossly normal neurologically. Skin:  Intact without significant lesions or rashes. Cervical Nodes:  No significant cervical adenopathy. Psych:  Alert and cooperative. Normal mood and affect.

## 2012-10-31 NOTE — Interval H&P Note (Signed)
History and Physical Interval Note:  10/31/2012 10:30 AM  Beth Holt  has presented today for surgery, with the diagnosis of DIARRHEA, BLOATING AND GERD  The various methods of treatment have been discussed with the patient and family. After consideration of risks, benefits and other options for treatment, the patient has consented to  Procedure(s) with comments: COLONOSCOPY WITH ESOPHAGOGASTRODUODENOSCOPY (EGD) (N/A) - 9:45 as a surgical intervention .  The patient's history has been reviewed, patient examined, no change in status, stable for surgery.  I have reviewed the patient's chart and labs.  Questions were answered to the patient's satisfaction.   CBC okay except for slightly elevated eosinophils; serum IgA normal. Serum TTG not elevated. EGD and colonoscopy per plan.. The risks, benefits, limitations, imponderables and alternatives regarding both EGD and colonoscopy have been reviewed with the patient. Questions have been answered. All parties agreeable.   [name]

## 2012-10-31 NOTE — Progress Notes (Signed)
Quick Note:  Negative celiac serologies, Hgb normal, TSH normal. Proceed with TCS/EGD as planned for today, 3/20. ______

## 2012-10-31 NOTE — Op Note (Signed)
[facility] [address] [place], [address]   COLONOSCOPY PROCEDURE REPORT  PATIENT: Beth Holt, Beth Holt  MR#:         [id number] BIRTHDATE: 1986/06/06 , 26  yrs. old GENDER: Female ENDOSCOPIST: R.  Roetta Sessions, MD FACP FACG REFERRED BY:  Darlina Rumpf, M.D. PROCEDURE DATE:  10/31/2012 PROCEDURE:     Ileocolonoscopy with stool sampling and segmental biopsy  INDICATIONS: Chronic diarrhea; elevated peripheral eosinophil count  INFORMED CONSENT:  The risks, benefits, alternatives and imponderables including but not limited to bleeding, perforation as well as the possibility of a missed lesion have been reviewed.  The potential for biopsy, lesion removal, etc. have also been discussed.  Questions have been answered.  All parties agreeable. Please see the history and physical in the medical record for more information.  MEDICATIONS: Versed 7 mg IV and Demerol 100 mg IV in divided doses. Zofran 4 mg IV  DESCRIPTION OF PROCEDURE:  After a digital rectal exam was performed, the [id number] ([id number])  colonoscope was advanced from the anus through the rectum and colon to the area of the cecum, ileocecal valve and appendiceal orifice.  The cecum was deeply intubated.  These structures were well-seen and photographed for the record.  From the level of the cecum and ileocecal valve, the scope was slowly and cautiously withdrawn.  The mucosal surfaces were carefully surveyed utilizing scope tip deflection to facilitate fold flattening as needed.  The scope was pulled down into the rectum where a thorough examination including retroflexion was performed.    FINDINGS:  Adequate preparation. Normal rectum, colon and distal 10 cm of terminal ileum. colon,  THERAPEUTIC / DIAGNOSTIC MANEUVERS PERFORMED:  segmental biopsies of the ascending and sigmoid segments taken for histology. Stool sample was submitted to the lab as well. . COMPLICATIONS: None  CECAL WITHDRAWAL  TIME:  12 minutes  IMPRESSION:   Normal rectum,  colon and terminal ileum - -status post biopsy and stool sample  RECOMMENDATIONS: Follow up on pending studies. See EGD report.   _______________________________ eSigned:  R. Roetta Sessions, MD FACP [facility] 10/31/2012 11:34 AM   CC:

## 2012-11-01 LAB — FECAL LACTOFERRIN, QUANT: Fecal Lactoferrin: NEGATIVE

## 2012-11-01 LAB — GIARDIA/CRYPTOSPORIDIUM SCREEN(EIA)
Cryptosporidium Screen (EIA): NEGATIVE
Giardia Screen - EIA: NEGATIVE

## 2012-11-03 ENCOUNTER — Encounter: Payer: Self-pay | Admitting: Internal Medicine

## 2012-11-04 ENCOUNTER — Encounter (HOSPITAL_COMMUNITY): Payer: Self-pay | Admitting: Internal Medicine

## 2012-11-04 ENCOUNTER — Encounter: Payer: Self-pay | Admitting: *Deleted

## 2012-11-04 LAB — OVA AND PARASITE EXAMINATION: Ova and parasites: NONE SEEN

## 2012-11-04 LAB — STOOL CULTURE

## 2012-11-06 ENCOUNTER — Telehealth: Payer: Self-pay | Admitting: Gastroenterology

## 2012-11-06 ENCOUNTER — Other Ambulatory Visit: Payer: Self-pay | Admitting: Gastroenterology

## 2012-11-06 NOTE — Telephone Encounter (Signed)
Please let patient know she had normal EGD, TCS, biopsies. No celiac noted, normal stool studies. This is a good thing!  However, we need to find out what is causing her symptoms. May be dealing with IBS, functional gut disorder, etc.   Let's have her avoid dairy products, and set her up for a hydrogen breath test to assess for bacterial overgrowth.  Then, follow-up with me only (leslie has not seen her before).

## 2012-11-06 NOTE — Telephone Encounter (Signed)
Patient is scheduled for HBT on Monday 11/18/12 at 7:30 am and I have mailed her the instructions

## 2012-11-06 NOTE — Telephone Encounter (Signed)
Pt aware of results  Leigh-Ann can you set her up for hydrogen breath test  Thanks

## 2012-11-07 NOTE — Telephone Encounter (Signed)
Patient changed her HBT to Friday 04/11 at 7:30

## 2012-11-14 ENCOUNTER — Telehealth: Payer: Self-pay | Admitting: Internal Medicine

## 2012-11-14 NOTE — Telephone Encounter (Signed)
Per Vicky K. W/[employer] HBT does not require preautorization

## 2012-11-22 ENCOUNTER — Encounter (HOSPITAL_COMMUNITY)
Admission: RE | Disposition: A | Discharge status: Home / Self Care | Payer: Self-pay | Source: Ambulatory Visit | Attending: Internal Medicine

## 2012-11-22 ENCOUNTER — Ambulatory Visit (HOSPITAL_COMMUNITY)
Admission: RE | Admit: 2012-11-22 | Discharge: 2012-11-22 | Disposition: A | Discharge status: Home / Self Care | Payer: Managed Care, Other (non HMO) | Source: Ambulatory Visit | Attending: Internal Medicine | Admitting: Internal Medicine

## 2012-11-22 ENCOUNTER — Encounter (HOSPITAL_COMMUNITY): Payer: Self-pay

## 2012-11-22 DIAGNOSIS — R142 Eructation: Secondary | ICD-10-CM | POA: Insufficient documentation

## 2012-11-22 DIAGNOSIS — R141 Gas pain: Secondary | ICD-10-CM | POA: Insufficient documentation

## 2012-11-22 DIAGNOSIS — R197 Diarrhea, unspecified: Secondary | ICD-10-CM | POA: Insufficient documentation

## 2012-11-22 HISTORY — PX: BACTERIAL OVERGROWTH TEST: SHX5739

## 2012-11-22 SURGERY — BREATH TEST, FOR INTESTINAL BACTERIAL OVERGROWTH

## 2012-11-22 MED ORDER — LACTULOSE 10 GM/15ML PO SOLN
37.5000 g | Freq: Once | ORAL | Status: AC
  Administered 2012-11-22: 37.5 g via ORAL
  Filled 2012-11-22: qty 60

## 2012-11-22 MED ORDER — LACTULOSE 10 GM/15ML PO SOLN
ORAL | Status: AC
  Filled 2012-11-22: qty 60

## 2012-11-22 NOTE — Progress Notes (Signed)
No beans, bran or high fiber cereal the day before the procedure? no NPO except for water 12 hours before procedure? yes No smoking, sleeping or vigorous exercising for at least 30 before procedure? no Recent antibiotic use and/or diarrhea? Yes-pt has recent diarrhea r/t being lactose intolerant but no antibiotics   If yes, physician notified.  Time Baseline 15 mins 30 mins 45 mins 60 mins 75 mins 90 mins 105 mins 120 mins 135 mins 150 mins 165 mins 180 mins  H2-ppm 5 6 5 6 6 8 7 7 8 15 9 9  4

## 2012-11-25 ENCOUNTER — Encounter (HOSPITAL_COMMUNITY): Payer: Self-pay | Admitting: Internal Medicine

## 2012-11-26 DIAGNOSIS — R142 Eructation: Secondary | ICD-10-CM

## 2012-11-26 DIAGNOSIS — R197 Diarrhea, unspecified: Secondary | ICD-10-CM

## 2012-11-26 DIAGNOSIS — R109 Unspecified abdominal pain: Secondary | ICD-10-CM

## 2012-11-26 DIAGNOSIS — R141 Gas pain: Secondary | ICD-10-CM

## 2012-11-26 DIAGNOSIS — R143 Flatulence: Secondary | ICD-10-CM

## 2012-11-26 NOTE — Op Note (Signed)
Date of Procedure: November 22, 2012 Primary Gastroenterologist: Dr. Jena Gauss  Procedure: Hydrogen Breath Test  Indications: 27 year old pleasant female with history of chronic bloating, diarrhea, and abdominal pain. Recent upper endoscopy and colonoscopy both normal.   Pre-Procedure: No beans, bran, high fiber cereal in the past 24 hours. She has remained NPO for 12 hours. No smoking or vigorous exercise before the procedure. No recent antibiotic use.   Test Sugar: Lactulose  Findings: Initial breathalyzer at baseline read 5 parts per million. This remained overall consistent until at 135 minutes, where it was 15 parts per million. No significant peaks noted.  Assessment: Hydrogen breath test completed and without convincing evidence of small bowel bacterial overgrowth. Beth Holt notes lactose intolerance, and she likely has significant IBS as the culprit of her symptoms. Last abdominal imaging via CT noted in 2005. Will discuss patient's case further with Dr. Jena Gauss. May need further imaging, but treatment with anti-spasmodics, probiotic, and appropriate diet will be first-line therapy in the interim.     Time  Baseline  15 mins  30 mins  45 mins  60 mins  75 mins  90 mins  105 mins  120 mins  135 mins  150 mins  165 mins  180 mins   H2-ppm  5  6  5  6  6  8  7  7  8  15  9  9   4

## 2012-12-02 ENCOUNTER — Encounter: Payer: Self-pay | Admitting: Gastroenterology

## 2012-12-05 ENCOUNTER — Telehealth: Payer: Self-pay | Admitting: Gastroenterology

## 2012-12-05 NOTE — Telephone Encounter (Signed)
Hydrogen breath test appears negative. Please offer pt f/u visit with me, non-urgent. Thanks!

## 2012-12-09 NOTE — Telephone Encounter (Signed)
Tried to call pt- LMOM 

## 2012-12-11 NOTE — Telephone Encounter (Signed)
Pt is aware, she stated she would call back and make the appointment.

## 2013-09-08 ENCOUNTER — Encounter (HOSPITAL_COMMUNITY): Payer: Self-pay | Admitting: Emergency Medicine

## 2013-09-08 ENCOUNTER — Emergency Department (HOSPITAL_COMMUNITY): Payer: Managed Care, Other (non HMO)

## 2013-09-08 ENCOUNTER — Emergency Department (HOSPITAL_COMMUNITY)
Admission: EM | Admit: 2013-09-08 | Discharge: 2013-09-08 | Disposition: A | Discharge status: Other Acute Inpatient Hospital | Payer: Managed Care, Other (non HMO) | Attending: Emergency Medicine | Admitting: Emergency Medicine

## 2013-09-08 DIAGNOSIS — Z91040 Latex allergy status: Secondary | ICD-10-CM | POA: Insufficient documentation

## 2013-09-08 DIAGNOSIS — G43909 Migraine, unspecified, not intractable, without status migrainosus: Secondary | ICD-10-CM | POA: Insufficient documentation

## 2013-09-08 DIAGNOSIS — T888XXA Other specified complications of surgical and medical care, not elsewhere classified, initial encounter: Secondary | ICD-10-CM

## 2013-09-08 DIAGNOSIS — R5381 Other malaise: Secondary | ICD-10-CM | POA: Insufficient documentation

## 2013-09-08 DIAGNOSIS — F411 Generalized anxiety disorder: Secondary | ICD-10-CM | POA: Insufficient documentation

## 2013-09-08 DIAGNOSIS — Z8719 Personal history of other diseases of the digestive system: Secondary | ICD-10-CM | POA: Insufficient documentation

## 2013-09-08 DIAGNOSIS — R109 Unspecified abdominal pain: Secondary | ICD-10-CM | POA: Insufficient documentation

## 2013-09-08 DIAGNOSIS — F4310 Post-traumatic stress disorder, unspecified: Secondary | ICD-10-CM | POA: Insufficient documentation

## 2013-09-08 DIAGNOSIS — F17200 Nicotine dependence, unspecified, uncomplicated: Secondary | ICD-10-CM | POA: Insufficient documentation

## 2013-09-08 DIAGNOSIS — K623 Rectal prolapse: Secondary | ICD-10-CM

## 2013-09-08 DIAGNOSIS — R112 Nausea with vomiting, unspecified: Secondary | ICD-10-CM | POA: Insufficient documentation

## 2013-09-08 DIAGNOSIS — M79609 Pain in unspecified limb: Secondary | ICD-10-CM | POA: Insufficient documentation

## 2013-09-08 DIAGNOSIS — L03319 Cellulitis of trunk, unspecified: Secondary | ICD-10-CM

## 2013-09-08 DIAGNOSIS — R509 Fever, unspecified: Secondary | ICD-10-CM | POA: Insufficient documentation

## 2013-09-08 DIAGNOSIS — IMO0002 Reserved for concepts with insufficient information to code with codable children: Secondary | ICD-10-CM | POA: Insufficient documentation

## 2013-09-08 DIAGNOSIS — I10 Essential (primary) hypertension: Secondary | ICD-10-CM | POA: Insufficient documentation

## 2013-09-08 DIAGNOSIS — L02219 Cutaneous abscess of trunk, unspecified: Secondary | ICD-10-CM | POA: Insufficient documentation

## 2013-09-08 DIAGNOSIS — Y838 Other surgical procedures as the cause of abnormal reaction of the patient, or of later complication, without mention of misadventure at the time of the procedure: Secondary | ICD-10-CM | POA: Insufficient documentation

## 2013-09-08 DIAGNOSIS — L0291 Cutaneous abscess, unspecified: Secondary | ICD-10-CM

## 2013-09-08 DIAGNOSIS — R5383 Other fatigue: Secondary | ICD-10-CM

## 2013-09-08 LAB — CBC WITH DIFFERENTIAL/PLATELET
Basophils Absolute: 0.1 10*3/uL (ref 0.0–0.1)
Basophils Relative: 1 % (ref 0–1)
Eosinophils Absolute: 0.5 10*3/uL (ref 0.0–0.7)
Eosinophils Relative: 5 % (ref 0–5)
HCT: 34.5 % — ABNORMAL LOW (ref 36.0–46.0)
Hemoglobin: 11.7 g/dL — ABNORMAL LOW (ref 12.0–15.0)
Lymphocytes Relative: 20 % (ref 12–46)
Lymphs Abs: 2.1 10*3/uL (ref 0.7–4.0)
MCH: 30 pg (ref 26.0–34.0)
MCHC: 33.9 g/dL (ref 30.0–36.0)
MCV: 88.5 fL (ref 78.0–100.0)
Monocytes Absolute: 0.4 10*3/uL (ref 0.1–1.0)
Monocytes Relative: 4 % (ref 3–12)
Neutro Abs: 7.1 10*3/uL (ref 1.7–7.7)
Neutrophils Relative %: 70 % (ref 43–77)
Platelets: 353 10*3/uL (ref 150–400)
RBC: 3.9 MIL/uL (ref 3.87–5.11)
RDW: 12.1 % (ref 11.5–15.5)
WBC: 10.1 10*3/uL (ref 4.0–10.5)

## 2013-09-08 LAB — COMPREHENSIVE METABOLIC PANEL
ALT: 11 U/L (ref 0–35)
AST: 14 U/L (ref 0–37)
Albumin: 3.3 g/dL — ABNORMAL LOW (ref 3.5–5.2)
Alkaline Phosphatase: 63 U/L (ref 39–117)
BUN: 13 mg/dL (ref 6–23)
CO2: 27 mEq/L (ref 19–32)
Calcium: 9 mg/dL (ref 8.4–10.5)
Chloride: 100 mEq/L (ref 96–112)
Creatinine, Ser: 0.79 mg/dL (ref 0.50–1.10)
GFR calc Af Amer: >90 mL/min (ref >90)
GFR calc non Af Amer: >90 mL/min (ref >90)
Glucose, Bld: 120 mg/dL — ABNORMAL HIGH (ref 70–99)
Potassium: 3.9 mEq/L (ref 3.7–5.3)
Sodium: 140 mEq/L (ref 137–147)
Total Bilirubin: 0.3 mg/dL (ref 0.3–1.2)
Total Protein: 7.1 g/dL (ref 6.0–8.3)

## 2013-09-08 LAB — LIPASE, BLOOD: Lipase: 14 U/L (ref 11–59)

## 2013-09-08 MED ORDER — HYDROMORPHONE HCL PF 1 MG/ML IJ SOLN
0.5000 mg | Freq: Once | INTRAMUSCULAR | Status: AC
  Administered 2013-09-08: 0.5 mg via INTRAVENOUS
  Filled 2013-09-08: qty 1

## 2013-09-08 MED ORDER — ONDANSETRON HCL 4 MG/2ML IJ SOLN
4.0000 mg | Freq: Once | INTRAMUSCULAR | Status: AC
  Administered 2013-09-08: 4 mg via INTRAVENOUS
  Filled 2013-09-08: qty 2

## 2013-09-08 MED ORDER — SODIUM CHLORIDE 0.9 % IV BOLUS (SEPSIS)
1000.0000 mL | Freq: Once | INTRAVENOUS | Status: AC
  Administered 2013-09-08: 1000 mL via INTRAVENOUS

## 2013-09-08 MED ORDER — IOHEXOL 300 MG/ML  SOLN
100.0000 mL | Freq: Once | INTRAMUSCULAR | Status: AC | PRN
  Administered 2013-09-08: 100 mL via INTRAVENOUS

## 2013-09-08 MED ORDER — IOHEXOL 300 MG/ML  SOLN
25.0000 mL | Freq: Once | INTRAMUSCULAR | Status: AC | PRN
  Administered 2013-09-08: 25 mL via ORAL

## 2013-09-08 MED ORDER — HYDROMORPHONE HCL PF 1 MG/ML IJ SOLN
1.0000 mg | Freq: Once | INTRAMUSCULAR | Status: AC
  Administered 2013-09-08: 1 mg via INTRAVENOUS
  Filled 2013-09-08: qty 1

## 2013-09-08 MED ORDER — SODIUM CHLORIDE 0.9 % IV SOLN
INTRAVENOUS | Status: DC
  Administered 2013-09-08: 18:00:00 via INTRAVENOUS

## 2013-09-08 NOTE — ED Notes (Signed)
Initial Contact - pt to RM7 with visitor, changed to hospital gown.  Pt reports rectal prolapse surgery x2 weeks ago at [facility].  Pt reports c/o low back pain 7/10 and intermittent fevers x5 days.  Pt is well appearing, ambulating without assistance. A+Ox4.  Skin PWD.  Speaking full/clear sentences, rr even/un-lab.  Abd s/nt/nd, pt denies n/v/d/c.  Pt denies b/b changes or complaints.  Pt denies n/t to extremities, neuros grossly intact.  NAD.  Awaiting EDP eval.

## 2013-09-08 NOTE — ED Notes (Signed)
Pt to move to main ED for further workup

## 2013-09-08 NOTE — ED Notes (Signed)
Pt to CT at this time.

## 2013-09-08 NOTE — ED Notes (Signed)
[facility]

## 2013-09-08 NOTE — ED Provider Notes (Signed)
CSN: [id number]     Arrival date & time 09/08/13  1307 History   First MD Initiated Contact with Patient 09/08/13 1417     Chief Complaint  Patient presents with  . Back Pain   (Consider location/radiation/quality/duration/timing/severity/associated sxs/prior Treatment) The history is provided by the patient. No language interpreter was used.  Felix AhmadiCaroline L Mcclish is a 28 y/o F with PMHx of GERD, ADD, migraines, HTN, anxiety, PTSD presenting to the ED with back pain that has been ongoing since Wednesday. Patient recently had rectal prolapse surgery performed on 08/26/2013 by Dr. Abbe AmsterdamHopkins at [facility] in [place], [place]. Reported that the surgery went well, was 6 hours long - denied any direct complications after the surgery. Patient reported that the back pain is a sharp shooting pain that is constant - radiating down the legs bilaterally. Reported that the pain radiates to her abdomen as well. Stated that she has been having difficulty with defecation - stated that her stools have been very difficult. Reported that her last BM was today while in the ED, but stated that it was small - stated that she has been drinking prune juice and taking colace. Stated that she has been having pelvic pain as well. Stated that she has pain when sitting down, laying, down, going from a sitting to a standing position - reported that she has been walking around in order to relieve the pain. Reported that she has been having weakness in her legs. Stated that she called the nurse at the hospital who recommended that patient to go to Urgent care - stated that she went to urgent care and they reported that this is a nerve issue. Patient stated that she called the nurse again today due to continued pain who recommended her to come to the ED. Patient reported that her next appointment with Dr. Abbe AmsterdamHopkins is on 09/17/2013. Stated that she been using neurontin and dilaudid with minimal relief. Reported intermittent fever episodes with  at least 5 episodes of emesis since her surgery. Denied chest pain, shortness of breath, difficulty breathing, numbness, tingling, loss of sensation.  PCP Dr. Kateri PlummerMorrow  Past Medical History  Diagnosis Date  . GERD (gastroesophageal reflux disease)   . ADD (attention deficit disorder)   . Migraines   . Hypertension   . Anxiety   . PTSD (post-traumatic stress disorder)    Past Surgical History  Procedure Laterality Date  . Tonsillectomy  2012  . Wisdom tooth extraction    . Colonoscopy with esophagogastroduodenoscopy (egd) N/A 10/31/2012    Procedure: COLONOSCOPY WITH ESOPHAGOGASTRODUODENOSCOPY (EGD);  Surgeon: Corbin Adeobert M Rourk, MD;  [facility]: [facility];  Service: Endoscopy;  Laterality: N/A;  9:45  . Bacterial overgrowth test N/A 11/22/2012    Procedure: BACTERIAL OVERGROWTH TEST;  Surgeon: Corbin Adeobert M Rourk, MD;  Location: [facility];  Service: Endoscopy;  Laterality: N/A;  7:30   Family History  Problem Relation Age of Onset  . Colon cancer Neg Hx   . Breast cancer Mother    History  Substance Use Topics  . Smoking status: Current Every Day Smoker -- 0.50 packs/day for 5 years  . Smokeless tobacco: Not on file  . Alcohol Use: No   OB History   Grav Para Term Preterm Abortions TAB SAB Ect Mult Living                 Review of Systems  Constitutional: Positive for fever (subjective). Negative for chills.  Respiratory: Negative for chest tightness and shortness  of breath.   Cardiovascular: Negative for chest pain.  Gastrointestinal: Positive for nausea, vomiting and abdominal pain. Negative for diarrhea, blood in stool and anal bleeding.  Genitourinary: Negative for dysuria and decreased urine volume.  Neurological: Positive for weakness. Negative for dizziness.  All other systems reviewed and are negative.    Allergies  Aspirin; Lactose intolerance (gi); Latex; Morphine and related; and Tylenol  Home Medications   Current Outpatient Rx  Name  Route  Sig  Dispense   Refill  . drospirenone-ethinyl estradiol (YASMIN 28) 3-0.03 MG tablet   Oral   Take 1 tablet by mouth daily.         Marland Kitchen gabapentin (NEURONTIN) 300 MG capsule   Oral   Take 300 mg by mouth 3 (three) times daily.         Marland Kitchen HYDROmorphone (DILAUDID) 2 MG tablet   Oral   Take 2 mg by mouth every 4 (four) hours as needed for severe pain.         Marland Kitchen lamoTRIgine (LAMICTAL) 25 MG tablet   Oral   Take 50 mg by mouth daily.         . QUEtiapine (SEROQUEL) 200 MG tablet   Oral   Take 400 mg by mouth at bedtime.          BP 121/71  Pulse 86  Temp(Src) 98.5 F (36.9 C) (Oral)  Resp 20  SpO2 100%  LMP 08/25/2013 Physical Exam  Nursing note and vitals reviewed. Constitutional: She is oriented to person, place, and time. She appears well-developed and well-nourished. No distress.  Patient sitting comfortably on bed  HENT:  Head: Normocephalic and atraumatic.  Mouth/Throat: Oropharynx is clear and moist. No oropharyngeal exudate.  Eyes: Conjunctivae and EOM are normal. Pupils are equal, round, and reactive to light. Right eye exhibits no discharge. Left eye exhibits no discharge.  Neck: Normal range of motion. Neck supple. No tracheal deviation present.  Cardiovascular: Normal rate, regular rhythm and normal heart sounds.  Exam reveals no friction rub.   No murmur heard. Pulses:      Radial pulses are 2+ on the right side, and 2+ on the left side.       Dorsalis pedis pulses are 2+ on the right side, and 2+ on the left side.  Pulmonary/Chest: Effort normal and breath sounds normal. No respiratory distress. She has no wheezes. She has no rales.  Abdominal: Soft. Bowel sounds are normal. There is tenderness. There is no guarding.    Positive abdominal swelling noted to the lower quadrants Discomfort upon palpation and auscultation to the lower abdominal region.   Genitourinary:  Anus with negative swelling, erythema, inflammation, lesions, sores, bleeding, discharge, negative  prolapse noted.   Musculoskeletal: Normal range of motion.       Back:  Negative swelling, erythema, inflammation, lesions, sores, bulging, deformities noted to the cervical/thoracic/lumbar/coccyx of the spine. Discomfort upon palpation to the lumbosacral region of the spine - mid-spinal and paraspinal bilaterally.  Full ROM to upper and lower extremities without difficulty noted, negative ataxia noted.  Lymphadenopathy:    She has no cervical adenopathy.  Neurological: She is alert and oriented to person, place, and time. No cranial nerve deficit. She exhibits normal muscle tone. Coordination normal.  Cranial nerves III-XII grossly intact Strength 5+/5+ to upper and lower extremities bilaterally with resistance applied, equal distribution noted Sensation intact to upper and lower extremities bilaterally Gait proper, proper balance - negative sway, negative drift, negative step-offs  Skin: Skin  is warm and dry. No rash noted. She is not diaphoretic. No erythema.  Psychiatric: She has a normal mood and affect. Her behavior is normal. Thought content normal.    ED Course  Procedures (including critical care time)  4:16 PM This provider spoke with Dr. Jonny Ruiz, colorectal surgeon from [facility]. Discussed case, history, presentation of patient. As per physician - agreed with blood work and recommended CT scan of abdomen and pelvis to be performed with contrast. If all okay and pain under control - patient can be discharged and call his office for follow-up sooner than 09/17/2013.   8:01 AM This provider spoke with Dr. Joellyn Quails regarding and imaging results on the CT scan. Due to not improved pain control - patient to be transferred to [facility] to be admitted to the hospital for pain control and possible drainage from the site. CT on CD and labs given to patient to bring with her to the ED.   8:29 PM This provider spoke with Dr. Clelia Croft from [facility] ED - discussed  plan of transfer to the ED and that Dr. Jonny Ruiz is aware of the patient's transfer.   Discussed plan for transfer with patient. Gave the patient the option of driving to the hospital or [facility] - patient opted for [facility] to transfer her.   Results for orders placed during the hospital encounter of 09/08/13  CBC WITH DIFFERENTIAL      Result Value Range   WBC 10.1  4.0 - 10.5 K/uL   RBC 3.90  3.87 - 5.11 MIL/uL   Hemoglobin 11.7 (*) 12.0 - 15.0 g/dL   HCT 16.1 (*) 09.6 - 04.5 %   MCV 88.5  78.0 - 100.0 fL   MCH 30.0  26.0 - 34.0 pg   MCHC 33.9  30.0 - 36.0 g/dL   RDW 40.9  81.1 - 91.4 %   Platelets 353  150 - 400 K/uL   Neutrophils Relative % 70  43 - 77 %   Neutro Abs 7.1  1.7 - 7.7 K/uL   Lymphocytes Relative 20  12 - 46 %   Lymphs Abs 2.1  0.7 - 4.0 K/uL   Monocytes Relative 4  3 - 12 %   Monocytes Absolute 0.4  0.1 - 1.0 K/uL   Eosinophils Relative 5  0 - 5 %   Eosinophils Absolute 0.5  0.0 - 0.7 K/uL   Basophils Relative 1  0 - 1 %   Basophils Absolute 0.1  0.0 - 0.1 K/uL  COMPREHENSIVE METABOLIC PANEL      Result Value Range   Sodium 140  137 - 147 mEq/L   Potassium 3.9  3.7 - 5.3 mEq/L   Chloride 100  96 - 112 mEq/L   CO2 27  19 - 32 mEq/L   Glucose, Bld 120 (*) 70 - 99 mg/dL   BUN 13  6 - 23 mg/dL   Creatinine, Ser 7.82  0.50 - 1.10 mg/dL   Calcium 9.0  8.4 - 95.6 mg/dL   Total Protein 7.1  6.0 - 8.3 g/dL   Albumin 3.3 (*) 3.5 - 5.2 g/dL   AST 14  0 - 37 U/L   ALT 11  0 - 35 U/L   Alkaline Phosphatase 63  39 - 117 U/L   Total Bilirubin 0.3  0.3 - 1.2 mg/dL   GFR calc non Af Amer >90  >90 mL/min   GFR calc Af Amer >90  >90 mL/min  LIPASE, BLOOD  Result Value Range   Lipase 14  11 - 59 U/L   Ct Abdomen Pelvis W Contrast  09/08/2013   CLINICAL DATA:  Prior rectal prolapse surgery.  Back pain.  EXAM: CT ABDOMEN AND PELVIS WITH CONTRAST  TECHNIQUE: Multidetector CT imaging of the abdomen and pelvis was performed using the standard protocol following bolus  administration of intravenous contrast.  CONTRAST:  25mL OMNIPAQUE IOHEXOL 300 MG/ML SOLN, OMNIPAQUE IOHEXOL 300 MG/ML SOLN  COMPARISON:  US ABDOMEN COMPLETE dated 01/25/2011; CT ABDOMEN W/CM dated 07/27/2004  FINDINGS: There is a 5.5 x 4.7 cm enhancing mass with central lucencies in the periphery of the right lobe of the liver. This is new from prior CT of 07/27/2004. Although this could represent a large developing benign hepatic lesion such as adenoma, focal nodular hyperplasia, or hemangioma given that this lesion is new, a primary hepatic malignancy or metastasis should be considered. Given central lucency present in the lesion a process such as a fibrolamellar hepatocellular carcinoma should be considered. No other focal hepatic abnormality is identified. The hepatic veins are patent. Portal vein is patent. Spleen is normal. Splenic vein is patent. Pancreas is normal. No biliary distention. The gallbladder appear normal.  Adrenals normal. Simple 10 mm cyst left kidney, midpole anterior. Innumerable punctate lucencies are noted throughout the remainder of the left kidney, too small to accurately characterize. No obstructing ureteral stone. The bladder is nondistended. Uterus and adnexa are unremarkable.  Shotty inguinal lymph nodes are noted. Shotty retroperitoneal lymph nodes noted. Abdominal aorta is widely patent. No aneurysm. The visceral vessels are patent.  Appendix normal. Large amount of stool noted throughout the colon suggesting constipation. No evidence of small bowel distention. Stomach is nondistended. No free air. No significant mesenteric abnormalities identified. No hernia. A 4.8 x 1.2 cm fluid collection in the presacral space. This may be related to patient's recent surgery. This could represent a seroma/ hematoma. Developing abscess cannot be excluded. No air is noted within the lesion. Follow-up scan is suggested to demonstrate clearing.  Heart size normal. Mild atelectasis versus  scarring left lung base. No acute bony abnormality identified. No lytic or sclerotic lesions identified.  IMPRESSION: 1. 5.4 x 4.7 cm enhancing mass with central lucencies in the periphery of the right lobe of the liver. This is new from prior CT of 07/27/2004. This suspicious for hepatic malignancy including primary hepatic malignancy or large metastasis. Given central lucency present in this lesion, a process such as a fibrolamellar hepatocellular carcinoma should be considered. MRI the liver may prove views further evaluation.  2. A 4.8 x 1.2 cm presacral fluid collection is present. This may be related to the patient's recent surgery. This could represent a seroma/hematoma. A developing abscess cannot be excluded. Follow-up scan is suggested to demonstrate clearing.  3.  Constipation.   Electronically Signed   By: Maisie Fus  Register   On: 09/08/2013 19:22    Labs Review Labs Reviewed  CBC WITH DIFFERENTIAL - Abnormal; Notable for the following:    Hemoglobin 11.7 (*)    HCT 34.5 (*)    All other components within normal limits  COMPREHENSIVE METABOLIC PANEL - Abnormal; Notable for the following:    Glucose, Bld 120 (*)    Albumin 3.3 (*)    All other components within normal limits  LIPASE, BLOOD   Imaging Review Ct Abdomen Pelvis W Contrast  09/08/2013   CLINICAL DATA:  Prior rectal prolapse surgery.  Back pain.  EXAM: CT ABDOMEN AND PELVIS  WITH CONTRAST  TECHNIQUE: Multidetector CT imaging of the abdomen and pelvis was performed using the standard protocol following bolus administration of intravenous contrast.  CONTRAST:  25mL OMNIPAQUE IOHEXOL 300 MG/ML SOLN, OMNIPAQUE IOHEXOL 300 MG/ML SOLN  COMPARISON:  US ABDOMEN COMPLETE dated 01/25/2011; CT ABDOMEN W/CM dated 07/27/2004  FINDINGS: There is a 5.5 x 4.7 cm enhancing mass with central lucencies in the periphery of the right lobe of the liver. This is new from prior CT of 07/27/2004. Although this could represent a large developing  benign hepatic lesion such as adenoma, focal nodular hyperplasia, or hemangioma given that this lesion is new, a primary hepatic malignancy or metastasis should be considered. Given central lucency present in the lesion a process such as a fibrolamellar hepatocellular carcinoma should be considered. No other focal hepatic abnormality is identified. The hepatic veins are patent. Portal vein is patent. Spleen is normal. Splenic vein is patent. Pancreas is normal. No biliary distention. The gallbladder appear normal.  Adrenals normal. Simple 10 mm cyst left kidney, midpole anterior. Innumerable punctate lucencies are noted throughout the remainder of the left kidney, too small to accurately characterize. No obstructing ureteral stone. The bladder is nondistended. Uterus and adnexa are unremarkable.  Shotty inguinal lymph nodes are noted. Shotty retroperitoneal lymph nodes noted. Abdominal aorta is widely patent. No aneurysm. The visceral vessels are patent.  Appendix normal. Large amount of stool noted throughout the colon suggesting constipation. No evidence of small bowel distention. Stomach is nondistended. No free air. No significant mesenteric abnormalities identified. No hernia. A 4.8 x 1.2 cm fluid collection in the presacral space. This may be related to patient's recent surgery. This could represent a seroma/ hematoma. Developing abscess cannot be excluded. No air is noted within the lesion. Follow-up scan is suggested to demonstrate clearing.  Heart size normal. Mild atelectasis versus scarring left lung base. No acute bony abnormality identified. No lytic or sclerotic lesions identified.  IMPRESSION: 1. 5.4 x 4.7 cm enhancing mass with central lucencies in the periphery of the right lobe of the liver. This is new from prior CT of 07/27/2004. This suspicious for hepatic malignancy including primary hepatic malignancy or large metastasis. Given central lucency present in this lesion, a process such as a  fibrolamellar hepatocellular carcinoma should be considered. MRI the liver may prove views further evaluation.  2. A 4.8 x 1.2 cm presacral fluid collection is present. This may be related to the patient's recent surgery. This could represent a seroma/hematoma. A developing abscess cannot be excluded. Follow-up scan is suggested to demonstrate clearing.  3.  Constipation.   Electronically Signed   By: Maisie Fus  Register   On: 09/08/2013 19:22    EKG Interpretation   None       MDM   1. Fluid collection at surgical site   2. Abscess   3. Rectal prolapse     Medications  0.9 %  sodium chloride infusion ( Intravenous New Bag/Given 09/08/13 1758)  HYDROmorphone (DILAUDID) injection 1 mg (not administered)  ondansetron (ZOFRAN) injection 4 mg (not administered)  sodium chloride 0.9 % bolus 1,000 mL (0 mLs Intravenous Stopped 09/08/13 1743)  HYDROmorphone (DILAUDID) injection 0.5 mg (0.5 mg Intravenous Given 09/08/13 1611)  ondansetron (ZOFRAN) injection 4 mg (4 mg Intravenous Given 09/08/13 1611)  iohexol (OMNIPAQUE) 300 MG/ML solution 25 mL (25 mLs Oral Contrast Given 09/08/13 1720)  ondansetron (ZOFRAN) injection 4 mg (4 mg Intravenous Given 09/08/13 1758)  HYDROmorphone (DILAUDID) injection 1 mg (1 mg Intravenous Given  09/08/13 1758)  iohexol (OMNIPAQUE) 300 MG/ML solution 100 mL (100 mLs Intravenous Contrast Given 09/08/13 1814)    Filed Vitals:   09/08/13 1447 09/08/13 1804  BP: 125/89 121/71  Pulse: 105 86  Temp: 98.2 F (36.8 C) 98.5 F (36.9 C)  TempSrc:  Oral  Resp: 20   SpO2: 100% 100%    Patient presenting to the ED with back pain that started on Wednesday described as a constant sharp, shooting pain localized to the lower back with radiation to the legs and abdomen. Reported that she has been taking dilaudid and gabapentin as prescribed with mild degradation of the pain. Stated that has been having at least one BM per day with normal appetite.  Alert and oriented. GCS 15. Heart  rate and rhythm normal. Lungs clear to auscultation. Radial and DP 2+ bilaterally. Full ROM to upper and lower extremities bilaterally. BS normoactive in all 4 quadrants. Mild swelling noted to lower quadrants with pain upon palpation. Incision sites noted with negative findings of infection/cellulitic findings. Negative LAD. Negative deformities noted to the spine. Discomfort upon palpation to the lumbosacral/coccyx of the spine - mid-spinal and paraspinal region. Full ROM noted to the lower extremities without difficulty. Sensation intact. Strength intact. Negative focal neurological deficits identified. This provider spoke with colorectal surgeon, Dr. Romeo Apple Hopkins-recommended CT abdomen and pelvis with contrast to be performed. Recommended pain controlled in ED setting. Reported that if CT abdomen and pelvis was negative and pain controlled in ED patient is cleared for discharge and for patient to call his office tomorrow to be reassessed by the end of this week. CBC negative elevation white blood cell count noted. CMP negative findings. Lipase negative elevation. CT abdomen and pelvis noted 5.4 x 4.7 cm enhancing mass with central lucencies in the periphery of the right lobe of the liver - suspicion high for hepatic malignancy including primary hepatic malignancy or large metastasis - cannot rule out fibrolamellar hepatocellular carcinoma. 4.8 x 1.2 cm presacral fluid collection is present - could represent a seroma/hematoma, developing abscess cannot be excluded. Patient constipated.  Discussed imaging results and reviewed with attending physician who recommended surgeon to be consulted regarding this finding.  Patient afebrile, stable. Doubt sepsis. Negative elevation of WBC with leukocytosis noted, negative fever, negative tachycardia, negative hypotension noted. Poor control of pain in ED setting - patient has not been able to sit down due to pain while in the ED, patient continues to walk around room.  Discussed case, labs, and imaging in great detail with Dr. Jonny Ruiz who recommended patient to be transferred to [facility] for pain control and drainage of possible abscess in the morning. Discussed labs and imaging results with patient and mother in great detail - discussed concern regarding mass in liver lobe, recommended MRI to be performed to be re-assessed and followed as an outpatient regarding this. Discussed plan for transfer with mother and patient - agreed to plan of transfer. Patient stable for transfer.   Raymon Mutton, PA-C 09/09/13 1052

## 2013-09-08 NOTE — ED Notes (Signed)
Pt ret from CT at this time, pt reports pain and nausea improved, declines further intervention at this time.  Pt self repositioning for comfort.  Family at bs.  NAD.  Awaiting dispo.

## 2013-09-08 NOTE — ED Notes (Signed)
Pt had rectal prolapse surgery two wks ago; c/o lower back pain shooting up and down back-pain started Wednesday; pt had surgery at [facility]

## 2013-09-10 NOTE — ED Provider Notes (Signed)
Medical screening examination/treatment/procedure(s) were conducted as a shared visit with non-physician practitioner(s) and myself.  I personally evaluated the patient during the encounter.  EKG Interpretation   None       Patient post op with worsening back and abd pain. Distended, but no free air, no peritonitis. Subjective fevers at home but none here. CT concerning for fluid collection, given her poorly controlled pain abscess is a concern. Will transfer to [facility] for evaluation by her surgeon there.  Audree Camel, MD 09/10/13 1017

## 2014-05-07 ENCOUNTER — Other Ambulatory Visit: Payer: Self-pay | Admitting: Gynecology

## 2014-05-08 LAB — CYTOLOGY - PAP

## 2015-05-12 ENCOUNTER — Other Ambulatory Visit: Payer: Self-pay | Admitting: Gynecology

## 2015-05-14 LAB — CYTOLOGY - PAP

## 2016-06-22 ENCOUNTER — Other Ambulatory Visit: Payer: Self-pay | Admitting: Obstetrics and Gynecology

## 2016-06-22 DIAGNOSIS — R928 Other abnormal and inconclusive findings on diagnostic imaging of breast: Secondary | ICD-10-CM

## 2016-07-03 ENCOUNTER — Ambulatory Visit
Admission: RE | Admit: 2016-07-03 | Discharge: 2016-07-03 | Disposition: A | Discharge status: Home / Self Care | Payer: BLUE CROSS/BLUE SHIELD | Source: Ambulatory Visit | Attending: Obstetrics and Gynecology | Admitting: Obstetrics and Gynecology

## 2016-07-03 ENCOUNTER — Other Ambulatory Visit: Payer: Self-pay | Admitting: Obstetrics and Gynecology

## 2016-07-03 DIAGNOSIS — R928 Other abnormal and inconclusive findings on diagnostic imaging of breast: Secondary | ICD-10-CM

## 2016-07-10 ENCOUNTER — Ambulatory Visit
Admission: RE | Admit: 2016-07-10 | Discharge: 2016-07-10 | Disposition: A | Discharge status: Home / Self Care | Payer: BLUE CROSS/BLUE SHIELD | Source: Ambulatory Visit | Attending: Obstetrics and Gynecology | Admitting: Obstetrics and Gynecology

## 2016-07-10 ENCOUNTER — Other Ambulatory Visit: Payer: Self-pay | Admitting: Obstetrics and Gynecology

## 2016-07-10 DIAGNOSIS — R928 Other abnormal and inconclusive findings on diagnostic imaging of breast: Secondary | ICD-10-CM

## 2017-07-04 ENCOUNTER — Other Ambulatory Visit: Payer: Self-pay | Admitting: Physical Medicine and Rehabilitation

## 2017-07-04 DIAGNOSIS — M5416 Radiculopathy, lumbar region: Secondary | ICD-10-CM

## 2017-07-11 ENCOUNTER — Ambulatory Visit
Admission: RE | Admit: 2017-07-11 | Discharge: 2017-07-11 | Disposition: A | Discharge status: Home / Self Care | Payer: BLUE CROSS/BLUE SHIELD | Source: Ambulatory Visit | Attending: Physical Medicine and Rehabilitation | Admitting: Physical Medicine and Rehabilitation

## 2017-07-11 ENCOUNTER — Other Ambulatory Visit: Payer: BLUE CROSS/BLUE SHIELD

## 2017-07-11 DIAGNOSIS — M5416 Radiculopathy, lumbar region: Secondary | ICD-10-CM

## 2017-08-08 ENCOUNTER — Encounter: Payer: Self-pay | Admitting: Neurology

## 2017-08-08 ENCOUNTER — Ambulatory Visit (INDEPENDENT_AMBULATORY_CARE_PROVIDER_SITE_OTHER): Payer: BLUE CROSS/BLUE SHIELD | Admitting: Neurology

## 2017-08-08 VITALS — BP 120/82 | HR 98 | Ht 66.5 in | Wt 151.0 lb

## 2017-08-08 DIAGNOSIS — R270 Ataxia, unspecified: Secondary | ICD-10-CM | POA: Diagnosis not present

## 2017-08-08 DIAGNOSIS — R209 Unspecified disturbances of skin sensation: Secondary | ICD-10-CM

## 2017-08-08 DIAGNOSIS — R200 Anesthesia of skin: Secondary | ICD-10-CM

## 2017-08-08 DIAGNOSIS — G35 Multiple sclerosis: Secondary | ICD-10-CM | POA: Diagnosis not present

## 2017-08-08 DIAGNOSIS — M5440 Lumbago with sciatica, unspecified side: Secondary | ICD-10-CM | POA: Diagnosis not present

## 2017-08-08 DIAGNOSIS — W19XXXA Unspecified fall, initial encounter: Secondary | ICD-10-CM

## 2017-08-08 DIAGNOSIS — G35D Multiple sclerosis, unspecified: Secondary | ICD-10-CM

## 2017-08-08 DIAGNOSIS — R2990 Unspecified symptoms and signs involving the nervous system: Secondary | ICD-10-CM | POA: Diagnosis not present

## 2017-08-08 DIAGNOSIS — IMO0001 Reserved for inherently not codable concepts without codable children: Secondary | ICD-10-CM

## 2017-08-08 MED ORDER — ALPRAZOLAM 0.25 MG PO TABS: ORAL_TABLET | ORAL | 0 refills | Status: DC

## 2017-08-08 NOTE — Patient Instructions (Signed)
MRI brain and cervical spine. If negative need MRI thoracic spine Labs today EMG/NCS Physical Therapy   Fall Prevention in the Home Falls can cause injuries. They can happen to people of all ages. There are many things you can do to make your home safe and to help prevent falls. What can I do on the outside of my home?  Regularly fix the edges of walkways and driveways and fix any cracks.  Remove anything that might make you trip as you walk through a door, such as a raised step or threshold.  Trim any bushes or trees on the path to your home.  Use bright outdoor lighting.  Clear any walking paths of anything that might make someone trip, such as rocks or tools.  Regularly check to see if handrails are loose or broken. Make sure that both sides of any steps have handrails.  Any raised decks and porches should have guardrails on the edges.  Have any leaves, snow, or ice cleared regularly.  Use sand or salt on walking paths during winter.  Clean up any spills in your garage right away. This includes oil or grease spills. What can I do in the bathroom?  Use night lights.  Install grab bars by the toilet and in the tub and shower. Do not use towel bars as grab bars.  Use non-skid mats or decals in the tub or shower.  If you need to sit down in the shower, use a plastic, non-slip stool.  Keep the floor dry. Clean up any water that spills on the floor as soon as it happens.  Remove soap buildup in the tub or shower regularly.  Attach bath mats securely with double-sided non-slip rug tape.  Do not have throw rugs and other things on the floor that can make you trip. What can I do in the bedroom?  Use night lights.  Make sure that you have a light by your bed that is easy to reach.  Do not use any sheets or blankets that are too big for your bed. They should not hang down onto the floor.  Have a firm chair that has side arms. You can use this for support while you get  dressed.  Do not have throw rugs and other things on the floor that can make you trip. What can I do in the kitchen?  Clean up any spills right away.  Avoid walking on wet floors.  Keep items that you use a lot in easy-to-reach places.  If you need to reach something above you, use a strong step stool that has a grab bar.  Keep electrical cords out of the way.  Do not use floor polish or wax that makes floors slippery. If you must use wax, use non-skid floor wax.  Do not have throw rugs and other things on the floor that can make you trip. What can I do with my stairs?  Do not leave any items on the stairs.  Make sure that there are handrails on both sides of the stairs and use them. Fix handrails that are broken or loose. Make sure that handrails are as long as the stairways.  Check any carpeting to make sure that it is firmly attached to the stairs. Fix any carpet that is loose or worn.  Avoid having throw rugs at the top or bottom of the stairs. If you do have throw rugs, attach them to the floor with carpet tape.  Make sure that you have  a light switch at the top of the stairs and the bottom of the stairs. If you do not have them, ask someone to add them for you. What else can I do to help prevent falls?  Wear shoes that: ? Do not have high heels. ? Have rubber bottoms. ? Are comfortable and fit you well. ? Are closed at the toe. Do not wear sandals.  If you use a stepladder: ? Make sure that it is fully opened. Do not climb a closed stepladder. ? Make sure that both sides of the stepladder are locked into place. ? Ask someone to hold it for you, if possible.  Clearly mark and make sure that you can see: ? Any grab bars or handrails. ? First and last steps. ? Where the edge of each step is.  Use tools that help you move around (mobility aids) if they are needed. These include: ? Canes. ? Walkers. ? Scooters. ? Crutches.  Turn on the lights when you go into a  dark area. Replace any light bulbs as soon as they burn out.  Set up your furniture so you have a clear path. Avoid moving your furniture around.  If any of your floors are uneven, fix them.  If there are any pets around you, be aware of where they are.  Review your medicines with your doctor. Some medicines can make you feel dizzy. This can increase your chance of falling. Ask your doctor what other things that you can do to help prevent falls. This information is not intended to replace advice given to you by your health care provider. Make sure you discuss any questions you have with your health care provider. Document Released: 05/27/2009 Document Revised: 01/06/2016 Document Reviewed: 09/04/2014 Elsevier Interactive Patient Education  [employer]2018 [employer] Inc[employer]

## 2017-08-08 NOTE — Progress Notes (Signed)
[facility]    Provider:  Dr Lucia Gaskins Referring Provider: Sheran Luz, MD Primary Care Physician:  Dr. Ethelene Hal  CC:  Falls, back pain and leg paresthesias and numbness   HPI:  Beth Holt is a 31 y.o. female here as a referral from Dr. Ethelene Hal for leg pain.  Past medical history of ADD, insomnia, depression, lumbar radiculitis, neck sprain and strain, PTSD, migraines, HTN, anxiety.  She is here with her mother who provides information. In August she had an incident in the bathtub, no fall but she said she twisted around with resultant back pain since then. She feels her legs will fall asleep and her feet and she has cramps in the lower extremities. Her whole legs fall asleep. She has electricity feelings in her legs in random spots and it hurts and itches. Was definitely as a result of the incident int he bathtub started feeling it immediately. She has reaction to Etodolac. Gabapentin and celebrex were started. She fell again at work, she slipped on black ice. She is out on worker's comp. She is hurting worse since a nerve block. Her "body contracted", her limbs fell asleep. The legs fall asleep and she feels like she is going to defecate. Symptoms happen when she walks as well. Sensory level up to the pelvis. Glynis Smiles has fallen. She has always had problems, she has a history of falls since early 46s. She has has multiple falls. She has fallen down the stairs. She falls every year. She takes Ambien to sleep every night. She has been on lithium in the past. She reports she had a rheumatologic panel because of other pain she had in the past, checked ANA and RF and other rheumatolig labs.   Reviewed notes, labs and imaging from outside physicians, which showed:  Referred notes from referring physician.  She was seen in [facility] orthopedics for follow-up of her back pain.  She reported pain, stiffness and numbness and tingling.  No improvement.  No medications used for pain.  Reports  her current pain level to be 7 out of 10.  MRI of the lumbar spine was completed November 2018.  She had mild disc desiccation at L5-S1.  No obvious neurocompressive lesion noted.  She notes that her symptoms started in August when she slipped in the bathtub.  She had a prescription for diclofenac and had a significant reaction.  Her legs are going to sleep all the way down to her feet.  This can occur when she is driving.  She continues to work as a Lawyer.  Exam was negative.  She was started on Neurontin and injections in the lower spine were recommended.  Also Celebrex was tried.  Personally reviewed MRI of the lumbar spine images which was unremarkable except for a very small disc bulge at L4-L5 and L5-S1 with some minimal foraminal stenosis, no significant canal stenosis, very unlikely impingement of any nerve roots.  Review of Systems: Patient complains of symptoms per HPI as well as the following symptoms: Weakness, dizziness, restless legs, blurred vision, fatigue, cramps, aching muscles. Pertinent negatives and positives per HPI. All others negative.   Social History   Socioeconomic History  . Marital status: Single    Spouse name: Not on file  . Number of children: Not on file  . Years of education: Not on file  . Highest education level: Not on file  Social Needs  . Financial resource strain: Not on file  . Food insecurity - worry: Not on  file  . Food insecurity - inability: Not on file  . Transportation needs - medical: Not on file  . Transportation needs - non-medical: Not on file  Occupational History  . Occupation: Scientist, research (medical): [employer]    Comment: [employer]  Tobacco Use  . Smoking status: Current Every Day Smoker    Packs/day: 0.50    Years: 5.00    Pack years: 2.50  . Smokeless tobacco: Never Used  Substance and Sexual Activity  . Alcohol use: No  . Drug use: No  . Sexual activity: Yes    Birth control/protection: OCP  Other Topics Concern  . Not on file    Social History Narrative  . Not on file    Family History  Problem Relation Age of Onset  . Breast cancer Mother   . Colon cancer Neg Hx   . Multiple sclerosis Neg Hx     Past Medical History:  Diagnosis Date  . ADD (attention deficit disorder)   . Anxiety   . GERD (gastroesophageal reflux disease)   . Hypertension   . Lumbar radiculitis   . Migraines   . Multiple neurological symptoms   . PTSD (post-traumatic stress disorder)     Past Surgical History:  Procedure Laterality Date  . BACTERIAL OVERGROWTH TEST N/A 11/22/2012   Procedure: BACTERIAL OVERGROWTH TEST;  Surgeon: Corbin Ade, MD;  Location: [facility];  Service: Endoscopy;  Laterality: N/A;  7:30  . COLONOSCOPY WITH ESOPHAGOGASTRODUODENOSCOPY (EGD) N/A 10/31/2012   Procedure: COLONOSCOPY WITH ESOPHAGOGASTRODUODENOSCOPY (EGD);  Surgeon: Corbin Ade, MD;  Location: [facility];  Service: Endoscopy;  Laterality: N/A;  9:45  . TONSILLECTOMY  2012  . WISDOM TOOTH EXTRACTION      Current Outpatient Medications  Medication Sig Dispense Refill  . celecoxib (CELEBREX) 200 MG capsule Take 300 mg by mouth daily.     Marland Kitchen gabapentin (NEURONTIN) 300 MG capsule Take 300 mg by mouth at bedtime.     Marland Kitchen lisdexamfetamine (VYVANSE) 40 MG capsule Take 40 mg by mouth every morning.    . methocarbamol (ROBAXIN) 750 MG tablet Take 750-1,500 mg by mouth every 8 (eight) hours as needed for muscle spasms.    Marland Kitchen zolpidem (AMBIEN CR) 12.5 MG CR tablet Take 12.5 mg by mouth at bedtime.    . ALPRAZolam (XANAX) 0.25 MG tablet Take 1-2 tabs 30-60 minutes before MRI. May repeat right before MRI if needed. Do not drive. 4 tablet 0   No current facility-administered medications for this visit.     Allergies as of 08/08/2017 - Review Complete 08/08/2017  Allergen Reaction Noted  . Lithium Anaphylaxis and Other (See Comments) 05/21/2013  . Aspirin  09/08/2013  . Lactose intolerance (gi)  10/28/2012  . Latex Nausea And Vomiting 10/30/2012   . Morphine and related Other (See Comments) 09/08/2013  . Tylenol [acetaminophen]  09/08/2013    Vitals: BP 120/82   Pulse 98   Ht 5' 6.5" (1.689 m)   Wt 151 lb (68.5 kg)   BMI 24.01 kg/m  Last Weight:  Wt Readings from Last 1 Encounters:  08/08/17 151 lb (68.5 kg)   Last Height:   Ht Readings from Last 1 Encounters:  08/08/17 5' 6.5" (1.689 m)   Physical exam: Exam: Gen: NAD, psychomotor agitation, conversant and slightly tangential                CV: Tachcardic, no MRG. No Carotid Bruits. No peripheral edema, warm, nontender Eyes: Conjunctivae clear  without exudates or hemorrhage  Neuro: Detailed Neurologic Exam  Speech:    Speech is normal; fluent and spontaneous with normal comprehension.  Cognition:    The patient is oriented to person, place, and time;     recent and remote memory intact;     language fluent;     normal attention, concentration,     fund of knowledge Cranial Nerves:    The pupils are round, and reactive to light, right pupil slightly larger but reactive. The fundi are normal and spontaneous venous pulsations are present. Visual fields are full to finger confrontation. Extraocular movements are intact. Trigeminal sensation is intact and the muscles of mastication are normal. The face is symmetric. The palate elevates in the midline. Hearing intact. Voice is normal. Shoulder shrug is normal. The tongue has normal motion without fasciculations.   Coordination:    Normal finger to nose and heel to shin. Normal rapid alternating movements.   Gait:    Heel-toe and tandem gait are normal.   Motor Observation:    No asymmetry, no atrophy, and no involuntary movements noted. Tone:    Normal muscle tone.    Posture:    Posture is normal. normal erect    Strength: Weakness of the lower extremities, mild more proximal than distal otherwise strength is V/V in the upper and lower limbs.      Sensation: intact to LT     Reflex Exam:  DTR's:     Deep tendon reflexes in the upper and lower extremities are brisk bilaterally.   Toes:    The toes are downgoing bilaterally.   Clonus:    Clonus is absent.       Assessment/Plan:   31 y.o. female here as a referral from Dr. Ethelene Halamos for leg pain.  Past medical history of ADD, insomnia, depression, lumbar radiculitis, neck sprain and strain, PTSD, migraines, HTN, anxiety. She has  a long history of imbalance and falls for years, subjective LE paresthesia. Need evaluation for MS, MRI brain and cervical spine. After this if unremarkable, will check thoracic spine since trauma to eval for myelopathy causing LE weakness and sensory changes. Unusual affect, psychomotor agitation, very talkative and slightly tangential, she has a worker's comp case ongoing for a fall, suspect there is a component of psychiatric disease complicating her reported symptoms.  MRI brain and cervical spine due to falls, LE weakness, ataxia, paresthesias and numbness and neuropsychiatric issues, eval for MS Physical therapy for LE symptoms as above EMG/NCS on bilateral lower extremities If negative above, MRI thoracic spine Labs today  Orders Placed This Encounter  Procedures  . MR BRAIN W WO CONTRAST  . MR CERVICAL SPINE W WO CONTRAST  . B12 and Folate Panel  . Methylmalonic acid, serum  . Heavy metals, blood  . Vitamin B6  . Vitamin B1  . Ambulatory referral to Physical Therapy  . NCV with EMG(electromyography)   Cc: Dr. Su Hiltamos  Antonia Ahern, MD  [facility] [address] [place], [place] [id number]  Phone [phone] Fax [phone]

## 2017-08-09 ENCOUNTER — Encounter (HOSPITAL_COMMUNITY): Payer: Self-pay

## 2017-08-09 ENCOUNTER — Other Ambulatory Visit: Payer: Self-pay

## 2017-08-09 ENCOUNTER — Ambulatory Visit (HOSPITAL_COMMUNITY): Payer: BLUE CROSS/BLUE SHIELD | Attending: Neurology

## 2017-08-09 DIAGNOSIS — R262 Difficulty in walking, not elsewhere classified: Secondary | ICD-10-CM | POA: Insufficient documentation

## 2017-08-09 DIAGNOSIS — R200 Anesthesia of skin: Secondary | ICD-10-CM

## 2017-08-09 DIAGNOSIS — M6281 Muscle weakness (generalized): Secondary | ICD-10-CM | POA: Insufficient documentation

## 2017-08-09 DIAGNOSIS — R202 Paresthesia of skin: Secondary | ICD-10-CM | POA: Insufficient documentation

## 2017-08-09 DIAGNOSIS — M545 Low back pain: Secondary | ICD-10-CM

## 2017-08-09 NOTE — Therapy (Signed)
[facility] [address] [place], [place], [address] Phone: [phone]   Fax:  [phone]  Physical Therapy Evaluation  Patient Details  Name: Beth Holt MRN: [id number] Date of Birth: [birth year] Referring Provider: Anson Fret MD   Encounter Date: 08/09/2017  PT End of Session - 08/09/17 1018    Visit Number  1    Number of Visits  13    Date for PT Re-Evaluation  08/30/17 3 weeks    Authorization Type  Blue Cross Blue Shield/BCBS other    Authorization Time Period  08/09/17 - 09/20/17    PT Start Time  0818    PT Stop Time  0903    PT Time Calculation (min)  45 min    Equipment Utilized During Treatment  Gait belt    Activity Tolerance  Patient tolerated treatment well    Behavior During Therapy  [facility] for tasks assessed/performed       Past Medical History:  Diagnosis Date  . ADD (attention deficit disorder)   . Anxiety   . GERD (gastroesophageal reflux disease)   . Hypertension   . Lumbar radiculitis   . Migraines   . Multiple neurological symptoms   . PTSD (post-traumatic stress disorder)     Past Surgical History:  Procedure Laterality Date  . BACTERIAL OVERGROWTH TEST N/A 11/22/2012   Procedure: BACTERIAL OVERGROWTH TEST;  Surgeon: Corbin Ade, MD;  [facility];  Service: Endoscopy;  Laterality: N/A;  7:30  . COLONOSCOPY WITH ESOPHAGOGASTRODUODENOSCOPY (EGD) N/A 10/31/2012   Procedure: COLONOSCOPY WITH ESOPHAGOGASTRODUODENOSCOPY (EGD);  Surgeon: Corbin Ade, MD;  [facility];  Service: Endoscopy;  Laterality: N/A;  9:45  . TONSILLECTOMY  2012  . WISDOM TOOTH EXTRACTION      There were no vitals filed for this visit.   Subjective Assessment - 08/09/17 0823    Subjective  Patient reports history of falling for last 10 years and states she falls at least 1-2 times per year. She reports it has become more frequent. She reports she has been experiencing back pain and BLE  numbness since a quasi-fall in August when she twisted and almost fell doing a 360 in her shower. She states she caught herself on wall of shower but that it "threw her out of position" and reports she has had numbness and tingling in both legs and back pain since then. She waited to be seen until November because she thought it would get better however she then experienced another fall on black ice during the snow storm ~ 3 weeks ago and wen to see her PCP. They gave her a prescription for pain which she had an allergic reaction and discontinued the medicine. Her PCP then referred her to a neurologist. She states her neurologist referred her to PT and is going to perform a nerve conduction velocity test and an MRI to screen out MS. She reports she had a nerve block when she was in the office at the S1 level and it cause severe pain, she described it as her entire body contracting and she got sweaty and went "ghost white". She is having the NVC test done on 08/30/17 and is waiting to be approved for the MRI. She reports she used to be very flexible and since her fall she has felt tight overall and state that when she raises her leg it feels like it is "falling asleep". She reports she is also in a workers  comp case for the fall on ice outside of her workplace.    Limitations  Sitting;Standing;Walking;Other (comment);Lifting;House hold activities laying down    How long can you sit comfortably?  she cannot sit still and needs to figit to keep her legs from falling asleep    How long can you stand comfortably?  standing still is uncomfortable and she has to figit    How long can you walk comfortably?  she feels like she has a limp now and pain increases down her legs and in her back with <5 minutes of walking    Diagnostic tests  waiting on NCV test and MRI for brain and c-spine    Patient Stated Goals  to be able to get dressed and take a shower/bath wihtout pain and with more ease    Currently in Pain?  Yes     Pain Score  6     Pain Location  Back    Pain Orientation  Right;Left;Mid;Lower    Pain Descriptors / Indicators  Stabbing;Aching static elelctricity in back and throughout legs    Pain Radiating Towards  radiates into BLE with numbness and tingling    Pain Onset  More than a month ago    Pain Frequency  Constant    Aggravating Factors   unsure    Pain Relieving Factors  unsure    Effect of Pain on Daily Activities  reports severe impairment, difficult to donn socks, showerign feels like a job and difficult to get in.out of bed now    Multiple Pain Sites  No         [facility] PT Assessment - 08/09/17 0001      Assessment   Medical Diagnosis  Bilateral LE numbness with Low Back Pain    Referring Provider  Anson FretAhern, Antonia B. MD    Onset Date/Surgical Date  04/09/17 approximate    Prior Therapy  no      Precautions   Precautions  None      Restrictions   Weight Bearing Restrictions  No      Balance Screen   Has the patient fallen in the past 6 months  Yes    How many times?  2 or more    Has the patient had a decrease in activity level because of a fear of falling?   Yes    Is the patient reluctant to leave their home because of a fear of falling?   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    Leisure  enjoys tennis, used to run      CopyCognition   Overall Cognitive Status  Within Functional Limits for tasks assessed      Observation/Other Assessments   Observations  patient figiting while sitting or standing, wringing her hands and tapping her feet throughout evaluation    Focus on Therapeutic Outcomes (FOTO)   63% limited      Sensation   Light Touch  Appears Intact to light touch      Functional Tests   Functional tests  Single leg stance;Other;Other2       Single Leg Stance   Comments  unable to perform BLE, requires external support  Other:   Other/ Comments  Babinski negative for BLE with typical toe flexion response      Other:   Other/Comments  negative for BUE clonus and BLE clonus; 2-3 beats of clonus with ankle dorsiflexion for BLE, this is within normal limits      Posture/Postural Control   Posture/Postural Control  Postural limitations    Postural Limitations  Rounded Shoulders    Posture Comments  slouched posture      AROM   Overall AROM Comments  ROM within functional limits for BUE/BLE      Strength   Overall Strength Comments  BUE strength is WFL and patient is pain free with resistance; pateint limited with LE strength tests due to pain with resistance for all tests, reported increased pain in low back and legs    Right Hip Flexion  4/5    Right Hip Extension  4-/5    Right Hip ABduction  4-/5    Left Hip Flexion  4/5 re-check for fatiguable weakness    Left Hip Extension  4-/5    Left Hip ABduction  4-/5    Right Knee Flexion  4+/5    Right Knee Extension  4+/5    Left Knee Flexion  4+/5    Left Knee Extension  4+/5    Right Ankle Dorsiflexion  4+/5    Right Ankle Plantar Flexion  4/5    Left Ankle Dorsiflexion  4+/5    Left Ankle Plantar Flexion  4/5      Palpation   Palpation comment  Reflex testing: L3, S1, C7 all 3+ (hyper-reflexive)      Special Tests    Special Tests  --      Ambulation/Gait   Ambulation/Gait  Yes    Ambulation/Gait Assistance  7: Independent    Ambulation Distance (Feet)  678 Feet    Gait Pattern  Step-through pattern    Ambulation Surface  Level    Gait velocity  1.14 m/s    Stairs  Yes    Stairs Assistance  7: Independent    Stair Management Technique  Alternating pattern;One rail Right patient reliant on railing for support    Number of Stairs  4    Height of Stairs  6    Gait Comments  patients with uneven step length, excessive trunk lean/shift, decreased/unequal  arm swing, appears slightly unsteady, slighlty decreased hip flexion and foot clearance      Balance   Balance comment  Patient with normal seated balance and impaired durign singel limb stance in stading       Objective measurements completed on examination: See above findings.      PT Education - 08/09/17 1209    Education provided  Yes    Education Details  Educated on findings and interventions we can provide to improve her mobility and reduce pain. Educated on POC timeline and plan for next session.    Person(s) Educated  Patient    Methods  Explanation    Comprehension  Verbalized understanding       PT Short Term Goals - 08/09/17 1204      PT SHORT TERM GOAL #1   Title  Patient will be independent with HEP and be able to demonstrate proper form/technique with exercises.    Time  2    Period  Weeks    Status  New    Target Date  08/23/17      PT SHORT TERM GOAL #2  Title  Patient will be able to perform SLS on bilateral LE for 10 seconds to have improved balance during dynamic gait and stairs.    Time  3    Period  Weeks    Status  New    Target Date  08/30/17      PT SHORT TERM GOAL #3   Title  Patient will have 1/2 grade increase in MMT for all groups tested to demonstrate increased functinoal strength and have decreased effort with performing functional mobility.    Time  3    Period  Weeks    Status  New        PT Long Term Goals - 08/09/17 1206      PT LONG TERM GOAL #1   Title  Patient will have 1 grade increase in MMT for all groups tested to demonstrate increased functinoal strength and have decreased effort with performing functional mobility.    Time  6    Period  Weeks    Status  New    Target Date  09/20/17      PT LONG TERM GOAL #2   Title  Patient will be able to perform SLS on bilateral LE for 20 seconds an be able to ascend/descend stairs with a step through pattern and no hand rails to improve independence with functional mobility.     Time  6    Period  Weeks    Status  New        Plan - 08/09/17 1020    Clinical Impression Statement  Ms. Nardelli presents for her initial evaluation for physical therapy for evaluation due to BLE weakness/numbness and tingling. Objective testing revealed impaired sensation in BLE and low back described as "static electricity pain", BLE muscle weakness, impaired gait, and impaired balance. [facility] test babinkski/clonus were both negative however her reflexes are slightly hyperactive for LE/UE, 3+. Ms. Wilhide reports some concerning symptoms upon subjective exam of difficulty releasing bowel when she is attempting to relieve herself, she denies difficulty with controlling her bladder. 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 She reported hot weather makes her feel worse or anytime she gets overheated. As the patient is currently being screened out for MS by her neurologist we will continue with plan of care and treat current gait, strength, and balance deficits to decrease her risk of falling, reduce pain and improve her QOL through functional mobility.     History and Personal Factors relevant to plan of care:  reports a rectal surgery 2 years ago for rectal prolapse    Clinical Presentation  Stable    Clinical Decision Making  Low    Rehab Potential  Fair    PT Frequency  2x / week    PT Duration  6 weeks    PT Treatment/Interventions  ADLs/Self Care Home Management;Cryotherapy;Electrical Stimulation;Moist Heat;DME Instruction;Gait training;Stair training;Functional mobility training;Therapeutic activities;Therapeutic exercise;Balance training;Neuromuscular re-education;Patient/family education;Manual techniques;Passive range of motion;Energy conservation    PT Next Visit Plan  Review evaluation and goals; initiate HEP and follow up on nerve conduction test and MRI for screening  MS. Check muscle tone. Ask about facial pain.    PT Home Exercise Plan  initiate next session    Consulted and Agree with Plan of Care  Patient       Patient will benefit from skilled therapeutic intervention in order to improve  the following deficits and impairments:  Abnormal gait, Impaired sensation, Pain, Decreased mobility, Decreased activity tolerance, Decreased endurance, Decreased strength, Decreased balance, Difficulty walking  Visit Diagnosis: Difficulty in walking, not elsewhere classified  Numbness and tingling of both legs  Bilateral low back pain, unspecified chronicity, with sciatica presence unspecified  Muscle weakness (generalized)     Problem List Patient Active Problem List   Diagnosis Date Noted  . Multiple neurological symptoms 08/08/2017  . Bloating 10/28/2012  . Diarrhea 10/28/2012  . GERD (gastroesophageal reflux disease) 10/28/2012     Valentino Saxon, PT, DPT Physical Therapist with [facility]  08/09/2017 12:39 PM    [facility] [address] [place], [place], [address] Phone: [phone]   Fax:  [phone]  Name: CHELCY BOLDA MRN: [id number] Date of Birth: 1985-11-08

## 2017-08-11 LAB — VITAMIN B1: Thiamine: 131.1 nmol/L (ref 66.5–200.0)

## 2017-08-11 LAB — B12 AND FOLATE PANEL
Folate: 5.2 ng/mL (ref >3.0)
Vitamin B-12: 305 pg/mL (ref 232–1245)

## 2017-08-11 LAB — METHYLMALONIC ACID, SERUM: Methylmalonic Acid: 171 nmol/L (ref 0–378)

## 2017-08-15 ENCOUNTER — Encounter (HOSPITAL_COMMUNITY): Payer: BLUE CROSS/BLUE SHIELD

## 2017-08-15 ENCOUNTER — Telehealth: Payer: Self-pay | Admitting: *Deleted

## 2017-08-15 ENCOUNTER — Telehealth: Payer: Self-pay | Admitting: Neurology

## 2017-08-15 NOTE — Telephone Encounter (Signed)
I called to schedule the patient for her MRI. She did not answer so I left a VM asking her to call back and speak with me or Irving Burton E.

## 2017-08-15 NOTE — Telephone Encounter (Signed)
-----   Message from Anson Fret, MD sent at 08/15/2017  8:26 AM EST ----- Labs normal

## 2017-08-15 NOTE — Telephone Encounter (Signed)
Called and spoke with patient. I informed her that her labs are normal. She verbalized understanding and appreciation. Also note that the number on her DPR is not right. The last four digits say [phone] but the number to call is [phone].

## 2017-08-16 ENCOUNTER — Encounter (HOSPITAL_COMMUNITY): Payer: Self-pay

## 2017-08-16 ENCOUNTER — Ambulatory Visit (HOSPITAL_COMMUNITY): Payer: BLUE CROSS/BLUE SHIELD | Attending: Neurology

## 2017-08-16 DIAGNOSIS — R202 Paresthesia of skin: Secondary | ICD-10-CM | POA: Diagnosis present

## 2017-08-16 DIAGNOSIS — R200 Anesthesia of skin: Secondary | ICD-10-CM | POA: Diagnosis not present

## 2017-08-16 DIAGNOSIS — R262 Difficulty in walking, not elsewhere classified: Secondary | ICD-10-CM | POA: Diagnosis present

## 2017-08-16 DIAGNOSIS — M545 Low back pain: Secondary | ICD-10-CM | POA: Diagnosis present

## 2017-08-16 DIAGNOSIS — M6281 Muscle weakness (generalized): Secondary | ICD-10-CM | POA: Diagnosis present

## 2017-08-16 NOTE — Therapy (Signed)
[facility] [address] [place], [place], [address] Phone: [phone]   Fax:  [phone]  Physical Therapy Treatment  Patient Details  Name: Beth Holt MRN: [id number] Date of Birth: 03/16/1986 Referring Provider: Anson Fret MD   Encounter Date: 08/16/2017  PT End of Session - 08/16/17 1449    Visit Number  2    Number of Visits  13    Date for PT Re-Evaluation  08/30/17    Authorization Type  Blue Cross Blue Shield/BCBS other    Authorization Time Period  08/09/17 - 09/20/17    PT Start Time  1438    PT Stop Time  1516    PT Time Calculation (min)  38 min    Equipment Utilized During Treatment  Gait belt    Activity Tolerance  Patient limited by pain;Patient tolerated treatment well    Behavior During Therapy  [facility] for tasks assessed/performed       Past Medical History:  Diagnosis Date  . ADD (attention deficit disorder)   . Anxiety   . GERD (gastroesophageal reflux disease)   . Hypertension   . Lumbar radiculitis   . Migraines   . Multiple neurological symptoms   . PTSD (post-traumatic stress disorder)     Past Surgical History:  Procedure Laterality Date  . BACTERIAL OVERGROWTH TEST N/A 11/22/2012   Procedure: BACTERIAL OVERGROWTH TEST;  Surgeon: Corbin Ade, MD;  [facility];  Service: Endoscopy;  Laterality: N/A;  7:30  . COLONOSCOPY WITH ESOPHAGOGASTRODUODENOSCOPY (EGD) N/A 10/31/2012   Procedure: COLONOSCOPY WITH ESOPHAGOGASTRODUODENOSCOPY (EGD);  Surgeon: Corbin Ade, MD;  [facility];  Service: Endoscopy;  Laterality: N/A;  9:45  . TONSILLECTOMY  2012  . WISDOM TOOTH EXTRACTION      There were no vitals filed for this visit.  Subjective Assessment - 08/16/17 1445    Subjective  Pt reports a fall in [place] end of December, required assistance to stand.  Continues to c/o lower back pain and Rt posterior thigh down to ankle.      Patient Stated Goals  to be able to get  dressed and take a shower/bath wihtout pain and with more ease    Currently in Pain?  Yes    Pain Score  7     Pain Location  Back Rt posterior LE 6/10    Pain Orientation  Right;Lower    Pain Radiating Towards  radiates into Rt LE posterior down to ankle    Pain Onset  More than a month ago    Pain Frequency  Constant    Aggravating Factors   unsure    Pain Relieving Factors  unsure    Effect of Pain on Daily Activities  reports severe impairement, difficult to donn socks, showering feels like a job and difficulty to get in and out of bed now                      [facility] Adult PT Treatment/Exercise - 08/16/17 0001      Exercises   Exercises  Lumbar      Lumbar Exercises: Stretches   Active Hamstring Stretch  1 rep    Active Hamstring Stretch Limitations  supine- limited by LBP    Piriformis Stretch  1 rep    Piriformis Stretch Limitations  supine with towel; limited by LBP      Lumbar Exercises: Standing   Other Standing Lumbar Exercises  tandem stance 2x 30" limited by pain    Other Standing Lumbar Exercises  SLS Lt 20", Rt 33"      Lumbar Exercises: Supine   Ab Set  5 reps;3 seconds    Bridge  5 reps    Bridge Limitations  limited by pain             PT Education - 08/16/17 1601    Education provided  Yes    Education Details  Reviewed goals, copy of eval given to pt, HEP established    Person(s) Educated  Patient    Methods  Explanation;Demonstration;Tactile cues;Verbal cues    Comprehension  Verbalized understanding;Returned demonstration;Need further instruction       PT Short Term Goals - 08/09/17 1204      PT SHORT TERM GOAL #1   Title  Patient will be independent with HEP and be able to demonstrate proper form/technique with exercises.    Time  2    Period  Weeks    Status  New    Target Date  08/23/17      PT SHORT TERM GOAL #2   Title  Patient will be able to perform SLS on bilateral LE for 10 seconds to have improved balance during  dynamic gait and stairs.    Time  3    Period  Weeks    Status  New    Target Date  08/30/17      PT SHORT TERM GOAL #3   Title  Patient will have 1/2 grade increase in MMT for all groups tested to demonstrate increased functinoal strength and have decreased effort with performing functional mobility.    Time  3    Period  Weeks    Status  New        PT Long Term Goals - 08/09/17 1206      PT LONG TERM GOAL #1   Title  Patient will have 1 grade increase in MMT for all groups tested to demonstrate increased functinoal strength and have decreased effort with performing functional mobility.    Time  6    Period  Weeks    Status  New    Target Date  09/20/17      PT LONG TERM GOAL #2   Title  Patient will be able to perform SLS on bilateral LE for 20 seconds an be able to ascend/descend stairs with a step through pattern and no hand rails to improve independence with functional mobility.    Time  6    Period  Weeks    Status  New            Plan - 08/16/17 1538    Clinical Impression Statement  Reviewed goals and copy of eval given to pt.  Session focus on establishing HEP.  Pt limited by LBP and Rt LE with standing exercises.  Attempted tandem and SLS, pt able to complete but was limited by pain with reports of increased radicular symptoms to posterior Rt LE.  Attempted core activation and proximal strengthening to assist with LBP and LE stretches added to address mobility.  Pt stated pain scale 7/10 at EOS.      Rehab Potential  Fair    PT Frequency  2x / week    PT Duration  6 weeks    PT Treatment/Interventions  ADLs/Self Care Home Management;Cryotherapy;Electrical Stimulation;Moist Heat;DME Instruction;Gait training;Stair training;Functional mobility training;Therapeutic activities;Therapeutic exercise;Balance training;Neuromuscular re-education;Patient/family education;Manual techniques;Passive range of motion;Energy conservation  PT Next Visit Plan  F/U with compliance  of HEP and follow up on nerve conduction test and MRI for screening MS. Check muscle tone. Ask about facial pain.    PT Home Exercise Plan  tandem stance, SLS, piriformis and hamstring stretches       Patient will benefit from skilled therapeutic intervention in order to improve the following deficits and impairments:  Abnormal gait, Impaired sensation, Pain, Decreased mobility, Decreased activity tolerance, Decreased endurance, Decreased strength, Decreased balance, Difficulty walking  Visit Diagnosis: Numbness and tingling of both legs  Difficulty in walking, not elsewhere classified  Bilateral low back pain, unspecified chronicity, with sciatica presence unspecified  Muscle weakness (generalized)     Problem List Patient Active Problem List   Diagnosis Date Noted  . Multiple neurological symptoms 08/08/2017  . Bloating 10/28/2012  . Diarrhea 10/28/2012  . GERD (gastroesophageal reflux disease) 10/28/2012   Becky Sax, LPTA; CBIS [phone]  Juel Burrow 08/16/2017, 4:02 PM  [facility] [address] [place], [place], [address] Phone: [phone]   Fax:  [phone]  Name: Beth Holt MRN: [id number] Date of Birth: 07-17-1986

## 2017-08-16 NOTE — Patient Instructions (Signed)
Tandem Stance    Right foot in front of left, heel touching toe both feet "straight ahead". Stand on Foot Triangle of Support with both feet. Balance in this position 30 seconds. Do with left foot in front of right.  Copyright  VHI. All rights reserved.   Single Leg Balance: Eyes Open    Stand on right leg with eyes open. Hold 30  seconds. 5 reps 2 times per day.  http://ggbe.exer.us/5   Copyright  VHI. All rights reserved.   Piriformis Stretch, Supine    Lie supine, one ankle crossed onto opposite knee. Holding bottom leg behind knee, gently pull legs toward chest until stretch is felt in buttock of top leg. Hold 30 seconds. For deeper stretch gently push top knee away from body.  Repeat 3 times per session.  Copyright  VHI. All rights reserved.   Hamstring Stretch    With other leg bent, foot flat, grasp right leg and slowly try to straighten knee. Hold 30 seconds. Repeat 3 times. Do 2 sessions per day.  http://gt2.exer.us/280   Copyright  VHI. All rights reserved.

## 2017-08-21 ENCOUNTER — Encounter (HOSPITAL_COMMUNITY): Payer: Self-pay

## 2017-08-21 ENCOUNTER — Ambulatory Visit (HOSPITAL_COMMUNITY): Payer: BLUE CROSS/BLUE SHIELD

## 2017-08-21 DIAGNOSIS — R262 Difficulty in walking, not elsewhere classified: Secondary | ICD-10-CM

## 2017-08-21 DIAGNOSIS — R200 Anesthesia of skin: Secondary | ICD-10-CM

## 2017-08-21 DIAGNOSIS — R202 Paresthesia of skin: Secondary | ICD-10-CM

## 2017-08-21 DIAGNOSIS — M545 Low back pain: Secondary | ICD-10-CM

## 2017-08-21 DIAGNOSIS — M6281 Muscle weakness (generalized): Secondary | ICD-10-CM

## 2017-08-21 NOTE — Therapy (Signed)
[facility] [address] [place], [place], [address] Phone: [phone]   Fax:  [phone]  Physical Therapy Treatment  Patient Details  Name: Beth Holt MRN: [id number] Date of Birth: Nov 18, 1985 Referring Provider: Anson Fret MD   Encounter Date: 08/21/2017  PT End of Session - 08/21/17 1412    Visit Number  3    Number of Visits  13    Date for PT Re-Evaluation  08/30/17    Authorization Type  Blue Cross Blue Shield/BCBS other    Authorization Time Period  08/09/17 - 09/20/17    PT Start Time  1400 pt late for apt     PT Stop Time  1432    PT Time Calculation (min)  32 min    Equipment Utilized During Treatment  Gait belt    Activity Tolerance  Patient tolerated treatment well;No increased pain       Past Medical History:  Diagnosis Date  . ADD (attention deficit disorder)   . Anxiety   . GERD (gastroesophageal reflux disease)   . Hypertension   . Lumbar radiculitis   . Migraines   . Multiple neurological symptoms   . PTSD (post-traumatic stress disorder)     Past Surgical History:  Procedure Laterality Date  . BACTERIAL OVERGROWTH TEST N/A 11/22/2012   Procedure: BACTERIAL OVERGROWTH TEST;  Surgeon: Corbin Ade, MD;  [facility];  Service: Endoscopy;  Laterality: N/A;  7:30  . COLONOSCOPY WITH ESOPHAGOGASTRODUODENOSCOPY (EGD) N/A 10/31/2012   Procedure: COLONOSCOPY WITH ESOPHAGOGASTRODUODENOSCOPY (EGD);  Surgeon: Corbin Ade, MD;  [facility];  Service: Endoscopy;  Laterality: N/A;  9:45  . TONSILLECTOMY  2012  . WISDOM TOOTH EXTRACTION      There were no vitals filed for this visit.  Subjective Assessment - 08/21/17 1359    Subjective  Pt reports she has felt good for the last 3 days, no reports of current pain or recent falls.  Reports compliance iwht HEP without questions, feels a lot more flexible today.      Patient Stated Goals  to be able to get dressed and take a  shower/bath wihtout pain and with more ease    Currently in Pain?  No/denies         [facility] PT Assessment - 08/21/17 0001      Assessment   Medical Diagnosis  Bilateral LE numbness with Low Back Pain    Referring Provider  Naomie Dean B. MD                  [facility] Adult PT Treatment/Exercise - 08/21/17 0001      Lumbar Exercises: Standing   Heel Raises  15 reps squat then heel raises    Functional Squats  15 reps squat then heel raise    Forward Lunge  15 reps front foot on BOSU    Other Standing Lumbar Exercises  tandem stance on foam with palvo RTB    Other Standing Lumbar Exercises  tall kneeling on BOSU with forward punches RTB 15x      Manual Therapy   Manual Therapy  Other (comment)    Manual therapy comments  Manual complete separate of rest of tx    Other Manual Therapy  4 min to assess SI alignment, no impairements noted               PT Short Term Goals - 08/09/17 1204      PT  SHORT TERM GOAL #1   Title  Patient will be independent with HEP and be able to demonstrate proper form/technique with exercises.    Time  2    Period  Weeks    Status  New    Target Date  08/23/17      PT SHORT TERM GOAL #2   Title  Patient will be able to perform SLS on bilateral LE for 10 seconds to have improved balance during dynamic gait and stairs.    Time  3    Period  Weeks    Status  New    Target Date  08/30/17      PT SHORT TERM GOAL #3   Title  Patient will have 1/2 grade increase in MMT for all groups tested to demonstrate increased functinoal strength and have decreased effort with performing functional mobility.    Time  3    Period  Weeks    Status  New        PT Long Term Goals - 08/09/17 1206      PT LONG TERM GOAL #1   Title  Patient will have 1 grade increase in MMT for all groups tested to demonstrate increased functinoal strength and have decreased effort with performing functional mobility.    Time  6    Period  Weeks    Status   New    Target Date  09/20/17      PT LONG TERM GOAL #2   Title  Patient will be able to perform SLS on bilateral LE for 20 seconds an be able to ascend/descend stairs with a step through pattern and no hand rails to improve independence with functional mobility.    Time  6    Period  Weeks    Status  New            Plan - 08/21/17 1848    Clinical Impression Statement  Pt late for today's session though arrived with reports of vast improvements with no pain and no recent fall.  Began session initially assessing SI alignment as was discussed with pt last session, all within good alignment.  Session focus on functional strengthening and balance activities on dynamic surfaces.  Pt able to complete therex with minimal cueing but did require extra attention to stay on task and complete all reps.  No reoprts of pain through session, was limited by fatigue.      Rehab Potential  Fair    PT Frequency  2x / week    PT Duration  6 weeks    PT Treatment/Interventions  ADLs/Self Care Home Management;Cryotherapy;Electrical Stimulation;Moist Heat;DME Instruction;Gait training;Stair training;Functional mobility training;Therapeutic activities;Therapeutic exercise;Balance training;Neuromuscular re-education;Patient/family education;Manual techniques;Passive range of motion;Energy conservation    PT Next Visit Plan  Next session continue wiht balance activities and functional strengthening.  Follow up on nerve conduction test and MRI for screening MS. Check muscle tone. Ask about facial pain.    PT Home Exercise Plan  tandem stance, SLS, piriformis and hamstring stretches       Patient will benefit from skilled therapeutic intervention in order to improve the following deficits and impairments:  Abnormal gait, Impaired sensation, Pain, Decreased mobility, Decreased activity tolerance, Decreased endurance, Decreased strength, Decreased balance, Difficulty walking  Visit Diagnosis: Numbness and tingling  of both legs  Difficulty in walking, not elsewhere classified  Muscle weakness (generalized)  Bilateral low back pain, unspecified chronicity, with sciatica presence unspecified     Problem List Patient Active  Problem List   Diagnosis Date Noted  . Multiple neurological symptoms 08/08/2017  . Bloating 10/28/2012  . Diarrhea 10/28/2012  . GERD (gastroesophageal reflux disease) 10/28/2012   Becky Sax, LPTA; CBIS [phone]  Juel Burrow 08/21/2017, 6:52 PM  [facility] [address] [place], [place], [address] Phone: [phone]   Fax:  [phone]  Name: Beth Holt MRN: [id number] Date of Birth: 09-19-1985

## 2017-08-22 ENCOUNTER — Ambulatory Visit: Payer: BLUE CROSS/BLUE SHIELD

## 2017-08-22 DIAGNOSIS — M5440 Lumbago with sciatica, unspecified side: Secondary | ICD-10-CM | POA: Diagnosis not present

## 2017-08-22 DIAGNOSIS — R200 Anesthesia of skin: Secondary | ICD-10-CM

## 2017-08-22 DIAGNOSIS — W19XXXA Unspecified fall, initial encounter: Secondary | ICD-10-CM

## 2017-08-22 DIAGNOSIS — R209 Unspecified disturbances of skin sensation: Secondary | ICD-10-CM

## 2017-08-22 DIAGNOSIS — G35 Multiple sclerosis: Secondary | ICD-10-CM | POA: Diagnosis not present

## 2017-08-22 DIAGNOSIS — G35D Multiple sclerosis, unspecified: Secondary | ICD-10-CM

## 2017-08-22 DIAGNOSIS — R270 Ataxia, unspecified: Secondary | ICD-10-CM | POA: Diagnosis not present

## 2017-08-22 DIAGNOSIS — IMO0001 Reserved for inherently not codable concepts without codable children: Secondary | ICD-10-CM

## 2017-08-22 MED ORDER — GADOPENTETATE DIMEGLUMINE 469.01 MG/ML IV SOLN: 15.0000 mL | Freq: Once | INTRAVENOUS | Status: AC | PRN

## 2017-08-23 ENCOUNTER — Encounter (HOSPITAL_COMMUNITY): Payer: BLUE CROSS/BLUE SHIELD

## 2017-08-28 ENCOUNTER — Ambulatory Visit (HOSPITAL_COMMUNITY): Payer: BLUE CROSS/BLUE SHIELD

## 2017-08-28 ENCOUNTER — Telehealth: Payer: Self-pay | Admitting: *Deleted

## 2017-08-28 NOTE — Telephone Encounter (Signed)
-----   Message from Anson Fret, MD sent at 08/24/2017  3:48 PM EST ----- MRI brain normal. Incidental note is made of chronic sinusitis. thanks

## 2017-08-28 NOTE — Telephone Encounter (Signed)
Patient returned my call. We discussed that her MRI brain is normal, incidental finding of chronic sinusitis. Also, MRI c-spine is essentially normal. She verbalized understanding. Next step in care is the EMG/NCS study. She verbalized understanding and appreciation.

## 2017-08-28 NOTE — Telephone Encounter (Addendum)
Called patient and LVM asking for call back.  ----- Message from Anson Fret, MD sent at 08/24/2017  3:49 PM EST ----- MRI cervical spine essentially normal thanks   Notes recorded by Anson Fret, MD on 08/24/2017 at 3:48 PM EST MRI brain normal. Incidental note is made of chronic sinusitis. thanks

## 2017-08-29 ENCOUNTER — Other Ambulatory Visit: Payer: Self-pay

## 2017-08-29 ENCOUNTER — Ambulatory Visit (HOSPITAL_COMMUNITY): Payer: BLUE CROSS/BLUE SHIELD

## 2017-08-29 ENCOUNTER — Encounter (HOSPITAL_COMMUNITY): Payer: Self-pay

## 2017-08-29 DIAGNOSIS — R262 Difficulty in walking, not elsewhere classified: Secondary | ICD-10-CM

## 2017-08-29 DIAGNOSIS — R202 Paresthesia of skin: Secondary | ICD-10-CM

## 2017-08-29 DIAGNOSIS — R200 Anesthesia of skin: Secondary | ICD-10-CM

## 2017-08-29 DIAGNOSIS — M6281 Muscle weakness (generalized): Secondary | ICD-10-CM

## 2017-08-29 DIAGNOSIS — M545 Low back pain: Secondary | ICD-10-CM

## 2017-08-29 NOTE — Patient Instructions (Signed)
   ELASTIC BAND LATERAL WALKS - 2-3 sets of 10 steps both directions (slow and controlled)  With an elastic band around your thighs, take steps to the side while keeping your feet spread apart. Keep your knees bent the entire time.

## 2017-08-29 NOTE — Therapy (Signed)
[place], [place], [address] Phone: [phone]   Fax:  [phone]  Physical Therapy Treatment/Discharge Summary   Patient Details  Name: Beth Holt MRN: [id number] Date of Birth: 14-Jun-1986 Referring Provider: Melvenia Beam MD  PHYSICAL THERAPY DISCHARGE SUMMARY  Visits from Start of Care: 4  Current functional level related to goals / functional outcomes: Re-assessment performed today and patient has met all short term and long-term goals. She reports her pain has been very low and denies having any at this time. When asked she reports feeling she has improved to 100% of the functional level she wanted to achieve. She denies any further falls and expressed an eagerness to return to work. She scored a 24/24 on her DGI today indicating she is not at risk for falls while ambulating, had a significant improvement in her FOTO outcome measure, demonstrated improved SLS balance, and improved her MMT by 1 grade for almost all muscle groups tested. As she has made significant progress in therapy, met all goals, and decreased her fall risk she will be discharged today. Patient was instructed to contact us if she has any concerns or questions regarding her HEP or if her MD feels she can benefit from more skilled PT services.    Remaining deficits: See below   Education / Equipment:  Eduacted on progress. Discussed plan to discharge. Updated HEP.    Plan: Patient agrees to discharge.  Patient goals were met. Patient is being discharged due to meeting the stated rehab goals.  ?????        Encounter Date: 08/29/2017  PT End of Session - 08/29/17 0826    Visit Number  4    Number of Visits  13    Date for PT Re-Evaluation  08/30/17    Authorization Type  Blue Cross Blue Shield/BCBS other    Authorization Time Period  08/09/17 - 09/20/17    PT Start Time  0826 patient arrived late    PT Stop Time  0904    PT  Time Calculation (min)  38 min    Equipment Utilized During Treatment  --    Activity Tolerance  Patient tolerated treatment well;No increased pain    Behavior During Therapy  WFL for tasks assessed/performed       Past Medical History:  Diagnosis Date  . ADD (attention deficit disorder)   . Anxiety   . GERD (gastroesophageal reflux disease)   . Hypertension   . Lumbar radiculitis   . Migraines   . Multiple neurological symptoms   . PTSD (post-traumatic stress disorder)     Past Surgical History:  Procedure Laterality Date  . BACTERIAL OVERGROWTH TEST N/A 11/22/2012   Procedure: BACTERIAL OVERGROWTH TEST;  Surgeon: Daneil Dolin, MD;  [facility];  Service: Endoscopy;  Laterality: N/A;  7:30  . COLONOSCOPY WITH ESOPHAGOGASTRODUODENOSCOPY (EGD) N/A 10/31/2012   Procedure: COLONOSCOPY WITH ESOPHAGOGASTRODUODENOSCOPY (EGD);  Surgeon: Daneil Dolin, MD;  [facility];  Service: Endoscopy;  Laterality: N/A;  9:45  . TONSILLECTOMY  2012  . WISDOM TOOTH EXTRACTION      There were no vitals filed for this visit.  Subjective Assessment - 08/29/17 0826    Subjective  Patient denies any more falls. Patient has been running 3-4 miles on her treadmill at home and has been keeping up with her HEP daily. She denies any onset of pain with running and increased activity. Patient also reports  she no has no difficulty showering anr getting dressed.    Diagnostic tests  MRI of C-spine and brain normal, NCV tomorrow    Patient Stated Goals  to be able to get dressed and take a shower/bath wihtout pain and with more ease    Currently in Pain?  No/denies         [facility] PT Assessment - 08/29/17 0001      Assessment   Medical Diagnosis  Bilateral LE numbness with Low Back Pain    Referring Provider  Ahern, Antonia B. MD    Onset Date/Surgical Date  08/30/17    Next MD Visit  08/30/17    Prior Therapy  no      Precautions   Precautions  None      Restrictions   Weight  Bearing Restrictions  No      Balance Screen   Has the patient fallen in the past 6 months  Yes      Observation/Other Assessments   Focus on Therapeutic Outcomes (FOTO)   6% limited  was 63% limited on 08/09/17      Single Leg Stance   Comments  30 seconds BLE was unable to perform on 08/09/17      AROM   Overall AROM   Within functional limits for tasks performed      Strength   Right Hip Flexion  5/5    Right Hip Extension  4+/5    Right Hip ABduction  4+/5    Left Hip Flexion  5/5    Left Hip Extension  4+/5    Left Hip ABduction  4+/5    Right Knee Flexion  5/5    Right Knee Extension  5/5    Left Knee Flexion  5/5    Left Knee Extension  5/5    Right Ankle Dorsiflexion  5/5    Right Ankle Plantar Flexion  5/5    Left Ankle Dorsiflexion  5/5    Left Ankle Plantar Flexion  5/5      Palpation   Palpation comment  Reflex testing: L3, S1, C7 all 3+ (hyper-reflexive)      Ambulation/Gait   Ambulation/Gait  Yes    Ambulation/Gait Assistance  7: Independent    Gait Pattern  Within Functional Limits    Ambulation Surface  Level    Stairs  Yes    Stairs Assistance  7: Independent    Stair Management Technique  Alternating pattern;No rails    Number of Stairs  8    Height of Stairs  6      Standardized Balance Assessment   Standardized Balance Assessment  Dynamic Gait Index      Dynamic Gait Index   Level Surface  Normal    Change in Gait Speed  Normal    Gait with Horizontal Head Turns  Normal    Gait with Vertical Head Turns  Normal    Gait and Pivot Turn  Normal    Step Over Obstacle  Normal    Step Around Obstacles  Normal    Steps  Normal    Total Score  24    DGI comment:  not at risk for fallign while ambulating       [facility] Adult PT Treatment/Exercise - 08/29/17 0001      Lumbar Exercises: Stretches   Active Hamstring Stretch  Left;Right;2 reps;30 seconds    Active Hamstring Stretch Limitations  12" box      Lumbar Exercises: Standing     Other  Standing Lumbar Exercises  Side step with green theraband 2x 15 feet both directions        PT Education - 08/29/17 1226    Education provided  Yes    Education Details  Eduacted on progress. Discussed plan to discharge. Updated HEP.    Person(s) Educated  Patient    Methods  Explanation;Demonstration;Handout    Comprehension  Verbalized understanding;Returned demonstration       PT Short Term Goals - 08/29/17 0832      PT SHORT TERM GOAL #1   Title  Patient will be independent with HEP and be able to demonstrate proper form/technique with exercises.    Time  2    Period  Weeks    Status  Achieved      PT SHORT TERM GOAL #2   Title  Patient will be able to perform SLS on bilateral LE for 10 seconds to have improved balance during dynamic gait and stairs.    Baseline  BLE: 30 seconds on level surface with eyes open    Time  3    Period  Weeks    Status  Achieved      PT SHORT TERM GOAL #3   Title  Patient will have 1/2 grade increase in MMT for all groups tested to demonstrate increased functinoal strength and have decreased effort with performing functional mobility.    Time  3    Period  Weeks    Status  Achieved        PT Long Term Goals - 08/29/17 0037      PT LONG TERM GOAL #1   Title  Patient will have 1 grade increase in MMT for all groups tested to demonstrate increased functinoal strength and have decreased effort with performing functional mobility.    Time  6    Period  Weeks    Status  Achieved      PT LONG TERM GOAL #2   Title  Patient will be able to perform SLS on bilateral LE for 20 seconds an be able to ascend/descend stairs with a step through pattern and no hand rails to improve independence with functional mobility.    Baseline  1/16/1/9 - 30 secodns BLE    Time  6    Period  Weeks    Status  Achieved        Plan - 08/29/17 0488    Clinical Impression Statement  Re-assessment performed today and patient has met all short term and long-term  goals. She reports her pain has been very low and denies having any at this time. When asked she reports feeling she has improved to 100% of the functional level she wanted to achieve. She denies any further falls and expressed an eagerness to return to work. She scored a 24/24 on her DGI today indicating she is not at risk for falls while ambulating, had a significant improvement in her FOTO outcome measure, demonstrated improved SLS balance, and improved her MMT by 1 grade for almost all muscle groups tested. As she has made significant progress in therapy, met all goals, and decreased her fall risk she will be discharged today. Patient was instructed to contact us if she has any concerns or questions regarding her HEP or if her MD feels she can benefit from more skilled PT services.     Rehab Potential  Fair    PT Frequency  2x / week    PT Duration  6 weeks  PT Treatment/Interventions  ADLs/Self Care Home Management;Cryotherapy;Electrical Stimulation;Moist Heat;DME Instruction;Gait training;Stair training;Functional mobility training;Therapeutic activities;Therapeutic exercise;Balance training;Neuromuscular re-education;Patient/family education;Manual techniques;Passive range of motion;Energy conservation    PT Next Visit Plan  Discharging this session    PT Home Exercise Plan  tandem stance, SLS, piriformis and hamstring stretches; 08/29/17 -side step with green theraband    Consulted and Agree with Plan of Care  Patient       Patient will benefit from skilled therapeutic intervention in order to improve the following deficits and impairments:  Abnormal gait, Impaired sensation, Pain, Decreased mobility, Decreased activity tolerance, Decreased endurance, Decreased strength, Decreased balance, Difficulty walking  Visit Diagnosis: Numbness and tingling of both legs  Difficulty in walking, not elsewhere classified  Muscle weakness (generalized)  Bilateral low back pain, unspecified chronicity,  with sciatica presence unspecified     Problem List Patient Active Problem List   Diagnosis Date Noted  . Multiple neurological symptoms 08/08/2017  . Bloating 10/28/2012  . Diarrhea 10/28/2012  . GERD (gastroesophageal reflux disease) 10/28/2012    Kipp Brood, PT, DPT Physical Therapist with [facility]  08/29/2017 12:29 PM    [facility] [address] [place], [place], [address] Phone: [phone]   Fax:  [phone]  Name: Beth Holt MRN: [id number] Date of Birth: 12-20-1985

## 2017-08-30 ENCOUNTER — Encounter: Payer: BLUE CROSS/BLUE SHIELD | Admitting: Neurology

## 2017-08-30 ENCOUNTER — Encounter (HOSPITAL_COMMUNITY): Payer: BLUE CROSS/BLUE SHIELD

## 2017-08-30 ENCOUNTER — Ambulatory Visit (INDEPENDENT_AMBULATORY_CARE_PROVIDER_SITE_OTHER): Payer: BLUE CROSS/BLUE SHIELD | Admitting: Neurology

## 2017-08-30 ENCOUNTER — Encounter: Payer: Self-pay | Admitting: *Deleted

## 2017-08-30 DIAGNOSIS — R200 Anesthesia of skin: Secondary | ICD-10-CM | POA: Diagnosis not present

## 2017-08-30 DIAGNOSIS — R202 Paresthesia of skin: Secondary | ICD-10-CM | POA: Diagnosis not present

## 2017-08-30 DIAGNOSIS — Z0289 Encounter for other administrative examinations: Secondary | ICD-10-CM

## 2017-08-30 NOTE — Progress Notes (Signed)
Full Name: Beth Holt Gender: Female MRN #: [id number] Date of Birth: [birth year]    Visit Date: 08/30/17 11:26 Age: 32 Years 3 Months Old Examining Physician: Naomie Dean, MD  Referring Physician: Sheran Luz, MD  History: 32 y.o. female here as a referral from Dr. Ethelene Hal for leg pain.  Past medical history of ADD, insomnia, depression, lumbar radiculitis, neck sprain and strain, PTSD, migraines, HTN, anxiety. She has  a long history of imbalance and falls for years, subjective LE and UE paresthesia. MRI brain, cervical spine, lumbar spine unremarkable. EMG/NCS to eval for causes of limb paresthesias. Also reviewed imaging with patient and mother, showed them images from MRI brain and cervical spine and explained results.  Patient has a T10 sensory level, recommended imaging of the thoracic spine as well to ensure no myelopathy, transverse myelitis or other spinal cord etiology but they want to hold off for now.  Reviewed labs which were all normal including b12 and folate (b12 was low normal 305 however methylmalonic acid was normal so not consistent with B12 deficiency). A total of 25 minutes was spent with this patient and mother face-to-face in addition to the time spent on emg/ncs. Over half this time was spent on counseling patient on the low back pain, paresthesias, numbness, falls diagnosis and different therapeutic and diagnostic options available.   Summary: All nerve and muscles (as detailed below) were within normal limits    Conclusion: This is a normal study  Cc: Dr. Su Hilt M.D.  [facility] [address] [place], [place] [address] Tel: [phone] Fax: [phone]        [facility]    Nerve / Sites Muscle Latency Ref. Amplitude Ref. Rel Amp Segments Distance Velocity Ref. Area    ms ms mV mV %  cm m/s m/s mVms  R Median - APB     Wrist APB 3.1 ?4.4 5.1 ?4.0 100 Wrist - APB 7   22.8     Upper arm APB 6.6  4.9  96.3 Upper arm -  Wrist 20 57 ?49 21.0  R Ulnar - ADM     Wrist ADM 2.4 ?3.3 9.6 ?6.0 100 Wrist - ADM 7   21.2     B.Elbow ADM 5.4  9.3  97 B.Elbow - Wrist 18 61 ?49 22.5     A.Elbow ADM 6.9  9.3  99.8 A.Elbow - B.Elbow 10 64 ?49 23.0         A.Elbow - Wrist      R Peroneal - EDB     Ankle EDB 3.8 ?6.5 7.3 ?2.0 100 Ankle - EDB 9   26.7     Fib head EDB 9.8  6.7  91.6 Fib head - Ankle 32 53 ?44 25.8     Pop fossa EDB 11.7  6.8  102 Pop fossa - Fib head 10 52 ?44 24.4         Pop fossa - Ankle      L Peroneal - EDB     Ankle EDB 4.8 ?6.5 3.9 ?2.0 100 Ankle - EDB 9   10.6     Fib head EDB 10.8  3.6  92.9 Fib head - Ankle 32 53 ?44 11.2     Pop fossa EDB 12.7  3.3  91.6 Pop fossa - Fib head 10 53 ?44 10.1         Pop fossa - Ankle      R Tibial -  AH     Ankle AH 3.4 ?5.8 18.0 ?4.0 100 Ankle - AH 9   38.9     Pop fossa AH 11.0  14.7  81.7 Pop fossa - Ankle 36 47 ?41 36.8  L Tibial - AH     Ankle AH 3.8 ?5.8 15.4 ?4.0 100 Ankle - AH 9   38.4     Pop fossa AH 11.2  12.8  82.9 Pop fossa - Ankle 36 49 ?41 36.8                 SNC    Nerve / Sites Rec. Site Peak Lat Ref.  Amp Ref. Segments Distance Peak Diff Ref.    ms ms V V  cm ms ms  R Radial - Anatomical snuff box (Forearm)     Forearm Wrist 2.3 ?2.9 21 ?15 Forearm - Wrist 10    R Sural - Ankle (Calf)     Calf Ankle 3.3 ?4.4 21 ?6 Calf - Ankle 14    L Sural - Ankle (Calf)     Calf Ankle 3.6 ?4.4 16 ?6 Calf - Ankle 14    R Superficial peroneal - Ankle     Lat leg Ankle 3.7 ?4.4 12 ?6 Lat leg - Ankle 14    L Superficial peroneal - Ankle     Lat leg Ankle 3.8 ?4.4 12 ?6 Lat leg - Ankle 14    R Median, Ulnar - Transcarpal comparison     Median Palm Wrist 2.1 ?2.2 70 ?35 Median Palm - Wrist 8       Ulnar Palm Wrist 2.1 ?2.2 19 ?12 Ulnar Palm - Wrist 8          Median Palm - Ulnar Palm  0.1 ?0.4  R Median - Orthodromic (Dig II, Mid palm)     Dig II Wrist 2.9 ?3.4 33 ?10 Dig II - Wrist 13    R Ulnar - Orthodromic, (Dig V, Mid palm)     Dig V Wrist 2.3  ?3.1 13 ?5 Dig V - Wrist 72                       F  Wave    Nerve F Lat Ref.   ms ms  R Tibial - AH 44.6 ?56.0  L Tibial - AH 44.9 ?56.0  R Ulnar - ADM 24.6 ?32.0             EMG full       EMG Summary Table    Spontaneous MUAP Recruitment  Muscle IA Fib PSW Fasc Other Amp Dur. Poly Pattern  R. Biceps brachii Normal None None None _______ Normal Normal Normal Normal  R. Deltoid Normal None None None _______ Normal Normal Normal Normal  R. Triceps brachii Normal None None None _______ Normal Normal Normal Normal  R. Pronator teres Normal None None None _______ Normal Normal Normal Normal  R. Opponens pollicis Normal None None None _______ Normal Normal Normal Normal  R. First dorsal interosseous Normal None None None _______ Normal Normal Normal Normal  R. Iliopsoas Normal None None None _______ Normal Normal Normal Normal  R. Vastus medialis Normal None None None _______ Normal Normal Normal Normal  R. Tibialis anterior Normal None None None _______ Normal Normal Normal Normal  R. Gastrocnemius (Medial head) Normal None None None _______ Normal Normal Normal Normal  R. Extensor hallucis longus Normal None None None _______ Normal Normal Normal Normal

## 2017-09-02 NOTE — Procedures (Signed)
      Full Name: Beth Holt Gender: Female MRN #: [id number] Date of Birth: 08/27/1985    Visit Date: 08/30/17 11:26 Age: 31 Years 3 Months Old Examining Physician: Tanekia Ryans, MD  Referring Physician: Richard Ramos, MD  History: 31 y.o. female here as a referral from Dr. Ramos for leg pain.  Past medical history of ADD, insomnia, depression, lumbar radiculitis, neck sprain and strain, PTSD, migraines, HTN, anxiety. She has  a long history of imbalance and falls for years, subjective LE and UE paresthesia. MRI brain, cervical spine, lumbar spine unremarkable. EMG/NCS to eval for causes of limb paresthesias. Also reviewed imaging with patient and mother, showed them images from MRI brain and cervical spine and explained results.  Patient has a T10 sensory level, recommended imaging of the thoracic spine as well to ensure no myelopathy, transverse myelitis or other spinal cord etiology but they want to hold off for now.  Reviewed labs which were all normal including b12 and folate (b12 was low normal 305 however methylmalonic acid was normal so not consistent with B12 deficiency). A total of 25 minutes was spent with this patient and mother face-to-face in addition to the time spent on emg/ncs. Over half this time was spent on counseling patient on the low back pain, paresthesias, numbness, falls diagnosis and different therapeutic and diagnostic options available.   Summary: All nerve and muscles (as detailed below) were within normal limits    Conclusion: This is a normal study  Cc: Dr. Ramos  Cornelio Parkerson M.D.  [facility] [address] , [place] [address] Tel: [phone] Fax: [phone]        MNC    Nerve / Sites Muscle Latency Ref. Amplitude Ref. Rel Amp Segments Distance Velocity Ref. Area    ms ms mV mV %  cm m/s m/s mVms  R Median - APB     Wrist APB 3.1 ?4.4 5.1 ?4.0 100 Wrist - APB 7   22.8     Upper arm APB 6.6  4.9  96.3 Upper arm -  Wrist 20 57 ?49 21.0  R Ulnar - ADM     Wrist ADM 2.4 ?3.3 9.6 ?6.0 100 Wrist - ADM 7   21.2     B.Elbow ADM 5.4  9.3  97 B.Elbow - Wrist 18 61 ?49 22.5     A.Elbow ADM 6.9  9.3  99.8 A.Elbow - B.Elbow 10 64 ?49 23.0         A.Elbow - Wrist      R Peroneal - EDB     Ankle EDB 3.8 ?6.5 7.3 ?2.0 100 Ankle - EDB 9   26.7     Fib head EDB 9.8  6.7  91.6 Fib head - Ankle 32 53 ?44 25.8     Pop fossa EDB 11.7  6.8  102 Pop fossa - Fib head 10 52 ?44 24.4         Pop fossa - Ankle      L Peroneal - EDB     Ankle EDB 4.8 ?6.5 3.9 ?2.0 100 Ankle - EDB 9   10.6     Fib head EDB 10.8  3.6  92.9 Fib head - Ankle 32 53 ?44 11.2     Pop fossa EDB 12.7  3.3  91.6 Pop fossa - Fib head 10 53 ?44 10.1         Pop fossa - Ankle      R Tibial -   AH     Ankle AH 3.4 ?5.8 18.0 ?4.0 100 Ankle - AH 9   38.9     Pop fossa AH 11.0  14.7  81.7 Pop fossa - Ankle 36 47 ?41 36.8  L Tibial - AH     Ankle AH 3.8 ?5.8 15.4 ?4.0 100 Ankle - AH 9   38.4     Pop fossa AH 11.2  12.8  82.9 Pop fossa - Ankle 36 49 ?41 36.8                 SNC    Nerve / Sites Rec. Site Peak Lat Ref.  Amp Ref. Segments Distance Peak Diff Ref.    ms ms V V  cm ms ms  R Radial - Anatomical snuff box (Forearm)     Forearm Wrist 2.3 ?2.9 21 ?15 Forearm - Wrist 10    R Sural - Ankle (Calf)     Calf Ankle 3.3 ?4.4 21 ?6 Calf - Ankle 14    L Sural - Ankle (Calf)     Calf Ankle 3.6 ?4.4 16 ?6 Calf - Ankle 14    R Superficial peroneal - Ankle     Lat leg Ankle 3.7 ?4.4 12 ?6 Lat leg - Ankle 14    L Superficial peroneal - Ankle     Lat leg Ankle 3.8 ?4.4 12 ?6 Lat leg - Ankle 14    R Median, Ulnar - Transcarpal comparison     Median Palm Wrist 2.1 ?2.2 70 ?35 Median Palm - Wrist 8       Ulnar Palm Wrist 2.1 ?2.2 19 ?12 Ulnar Palm - Wrist 8          Median Palm - Ulnar Palm  0.1 ?0.4  R Median - Orthodromic (Dig II, Mid palm)     Dig II Wrist 2.9 ?3.4 33 ?10 Dig II - Wrist 13    R Ulnar - Orthodromic, (Dig V, Mid palm)     Dig V Wrist 2.3  ?3.1 13 ?5 Dig V - Wrist 11                       F  Wave    Nerve F Lat Ref.   ms ms  R Tibial - AH 44.6 ?56.0  L Tibial - AH 44.9 ?56.0  R Ulnar - ADM 24.6 ?32.0             EMG full       EMG Summary Table    Spontaneous MUAP Recruitment  Muscle IA Fib PSW Fasc Other Amp Dur. Poly Pattern  R. Biceps brachii Normal None None None _______ Normal Normal Normal Normal  R. Deltoid Normal None None None _______ Normal Normal Normal Normal  R. Triceps brachii Normal None None None _______ Normal Normal Normal Normal  R. Pronator teres Normal None None None _______ Normal Normal Normal Normal  R. Opponens pollicis Normal None None None _______ Normal Normal Normal Normal  R. First dorsal interosseous Normal None None None _______ Normal Normal Normal Normal  R. Iliopsoas Normal None None None _______ Normal Normal Normal Normal  R. Vastus medialis Normal None None None _______ Normal Normal Normal Normal  R. Tibialis anterior Normal None None None _______ Normal Normal Normal Normal  R. Gastrocnemius (Medial head) Normal None None None _______ Normal Normal Normal Normal  R. Extensor hallucis longus Normal None None None _______ Normal Normal Normal Normal          

## 2017-09-04 ENCOUNTER — Encounter (HOSPITAL_COMMUNITY): Payer: BLUE CROSS/BLUE SHIELD

## 2017-09-06 ENCOUNTER — Encounter (HOSPITAL_COMMUNITY): Payer: BLUE CROSS/BLUE SHIELD

## 2017-09-11 ENCOUNTER — Encounter (HOSPITAL_COMMUNITY): Payer: BLUE CROSS/BLUE SHIELD | Admitting: Physical Therapy

## 2017-09-13 ENCOUNTER — Encounter (HOSPITAL_COMMUNITY): Payer: BLUE CROSS/BLUE SHIELD

## 2017-09-18 ENCOUNTER — Encounter (HOSPITAL_COMMUNITY): Payer: BLUE CROSS/BLUE SHIELD

## 2017-09-20 ENCOUNTER — Encounter (HOSPITAL_COMMUNITY): Payer: BLUE CROSS/BLUE SHIELD

## 2017-10-03 ENCOUNTER — Other Ambulatory Visit: Payer: Self-pay | Admitting: Obstetrics and Gynecology

## 2017-10-03 DIAGNOSIS — Z803 Family history of malignant neoplasm of breast: Secondary | ICD-10-CM

## 2017-11-13 ENCOUNTER — Other Ambulatory Visit: Payer: Self-pay | Admitting: Obstetrics and Gynecology

## 2017-11-27 ENCOUNTER — Ambulatory Visit
Admission: RE | Admit: 2017-11-27 | Discharge: 2017-11-27 | Disposition: A | Discharge status: Home / Self Care | Payer: BLUE CROSS/BLUE SHIELD | Source: Ambulatory Visit | Attending: Obstetrics and Gynecology | Admitting: Obstetrics and Gynecology

## 2017-11-27 DIAGNOSIS — Z803 Family history of malignant neoplasm of breast: Secondary | ICD-10-CM

## 2017-11-27 MED ORDER — GADOBENATE DIMEGLUMINE 529 MG/ML IV SOLN
14.0000 mL | Freq: Once | INTRAVENOUS | Status: AC | PRN
  Administered 2017-11-27: 14 mL via INTRAVENOUS

## 2017-11-30 ENCOUNTER — Other Ambulatory Visit: Payer: Self-pay | Admitting: Obstetrics and Gynecology

## 2017-11-30 DIAGNOSIS — R9389 Abnormal findings on diagnostic imaging of other specified body structures: Secondary | ICD-10-CM

## 2017-12-04 ENCOUNTER — Ambulatory Visit
Admission: RE | Admit: 2017-12-04 | Discharge: 2017-12-04 | Disposition: A | Discharge status: Home / Self Care | Payer: BLUE CROSS/BLUE SHIELD | Source: Ambulatory Visit | Attending: Obstetrics and Gynecology | Admitting: Obstetrics and Gynecology

## 2017-12-04 ENCOUNTER — Other Ambulatory Visit: Payer: Self-pay | Admitting: Obstetrics and Gynecology

## 2017-12-04 ENCOUNTER — Other Ambulatory Visit: Payer: Self-pay

## 2017-12-04 DIAGNOSIS — R9389 Abnormal findings on diagnostic imaging of other specified body structures: Secondary | ICD-10-CM

## 2017-12-04 DIAGNOSIS — R928 Other abnormal and inconclusive findings on diagnostic imaging of breast: Secondary | ICD-10-CM

## 2017-12-04 DIAGNOSIS — Z803 Family history of malignant neoplasm of breast: Secondary | ICD-10-CM

## 2018-01-01 IMAGING — MR MR LUMBAR SPINE W/O CM
5 series · 45 of 48 positions shown · non-contrast
Comparison: 09/08/2013 CT abdomen and pelvis

CLINICAL DATA: 31 y/o F; lower back pain with numbness in both
legs. Fall 4 months ago.

EXAM:
MRI LUMBAR SPINE WITHOUT CONTRAST
TECHNIQUE: Multiplanar, multisequence MR imaging of the lumbar spine was
performed. No intravenous contrast was administered.

[Series 2: tirm sag · sagittal · 4.0mm · 0.55mm/px · 6 of 15 slices shown]
[im 1/15]
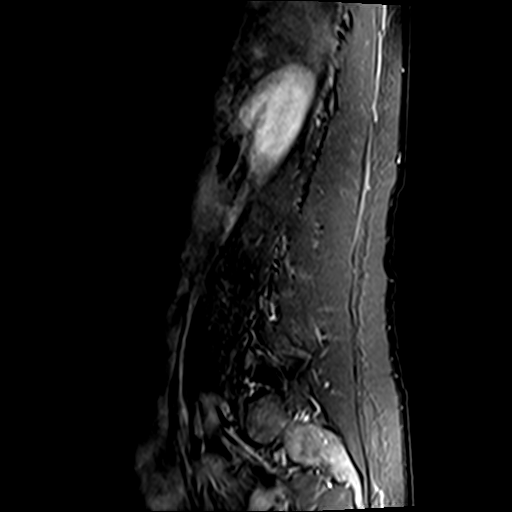
[im 3/15]
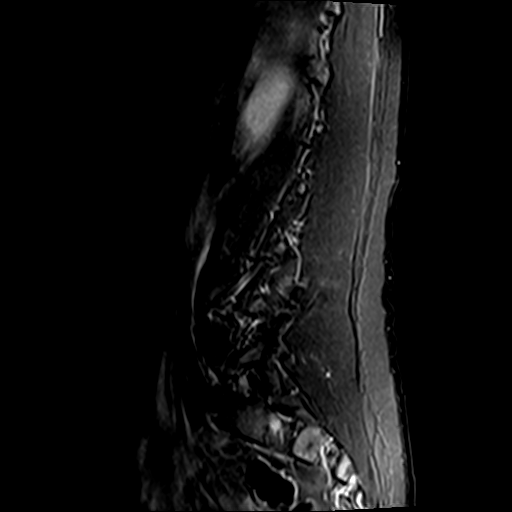
[im 6/15]
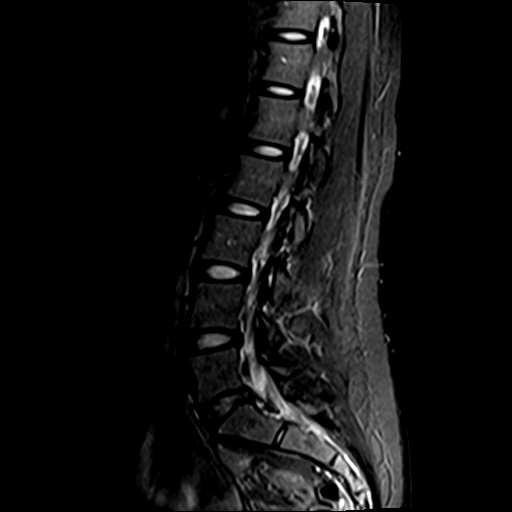
[im 9/15]
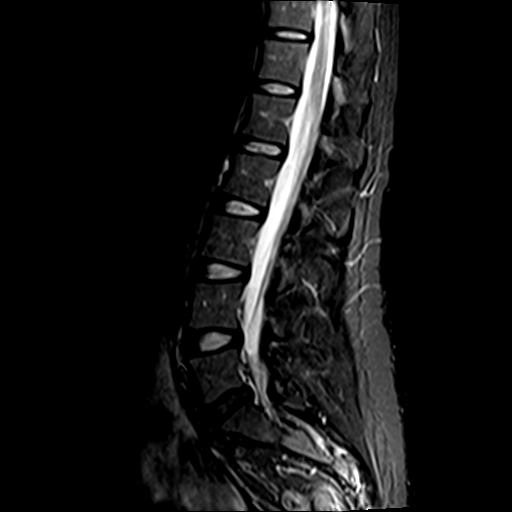
[im 12/15]
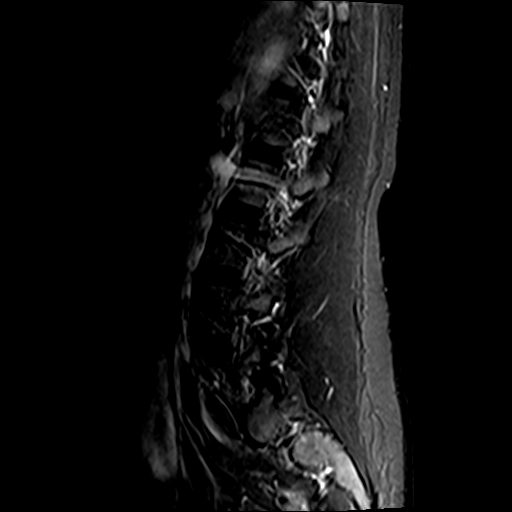
[im 15/15]
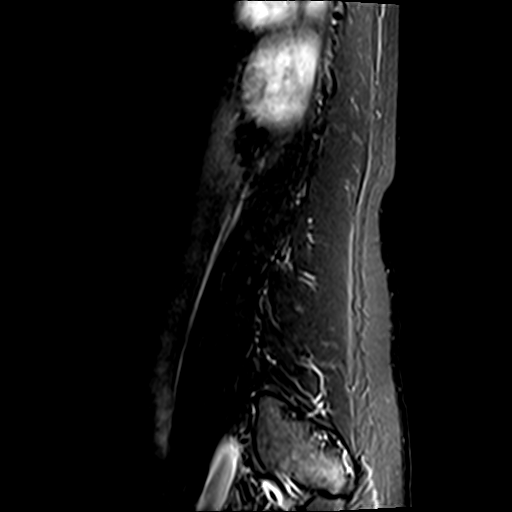

[Series 3: T2 · sagittal · 4.0mm · 0.88mm/px · 6 of 15 slices shown (1 of 2)]
[im 1/15]
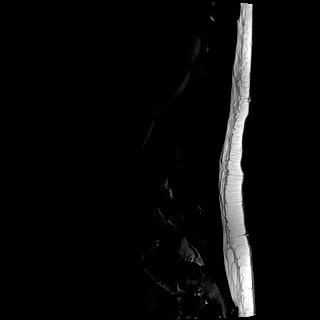
[im 3/15]
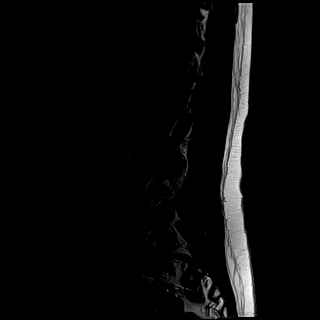
[im 6/15]
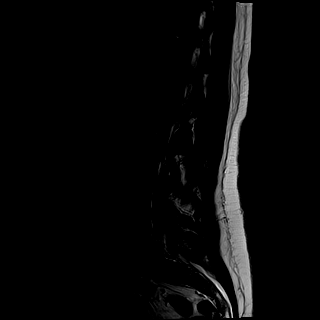
[im 9/15]
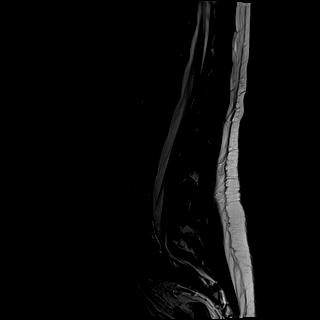
[im 12/15]
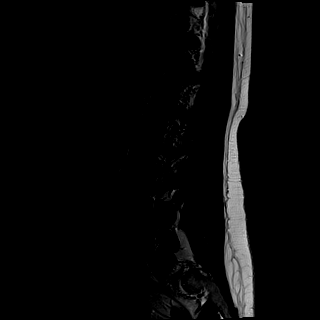
[im 15/15]
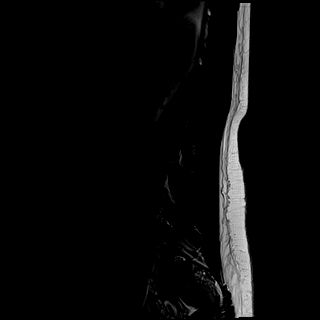

[Series 4: T1 · sagittal · 4.0mm · 0.88mm/px · 6 of 15 slices shown (1 of 2)]
[im 1/15]
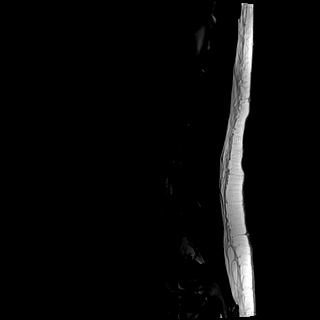
[im 3/15]
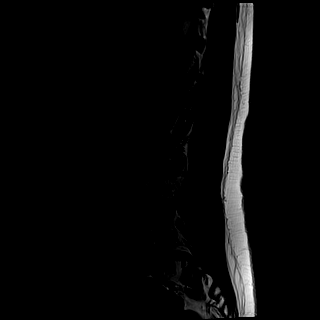
[im 6/15]
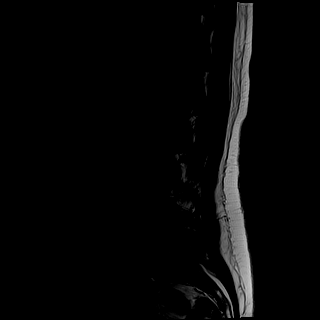
[im 9/15]
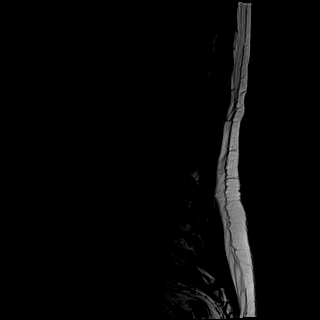
[im 12/15]
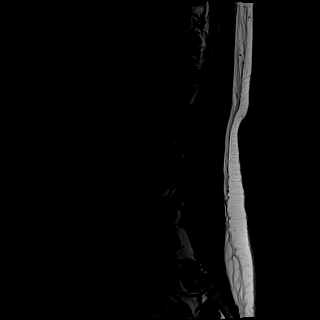
[im 15/15]
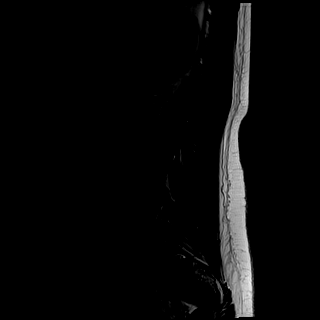

[Series 5: T2 · axial · 4.0mm · 0.78mm/px · z∈[-87,+137]mm · 15 of 37 slices shown (2 of 2)]
[im 1/37]
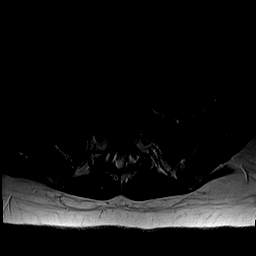
[im 3/37]
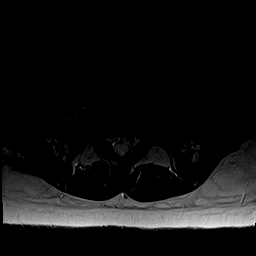
[im 6/37]
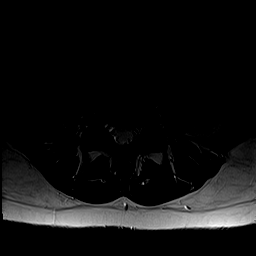
[im 8/37]
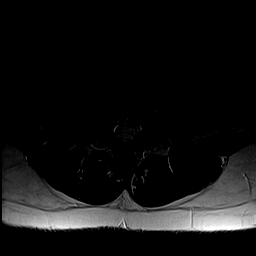
[im 11/37]
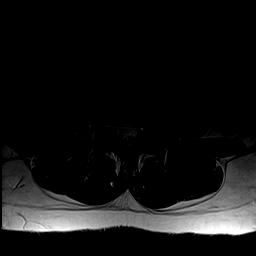
[im 13/37]
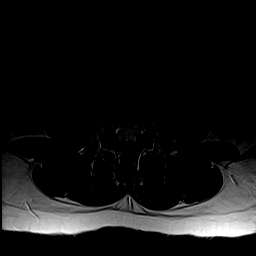
[im 16/37]
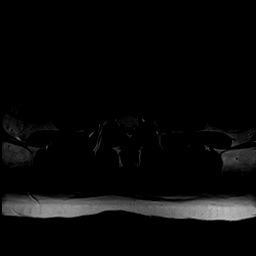
[im 19/37]
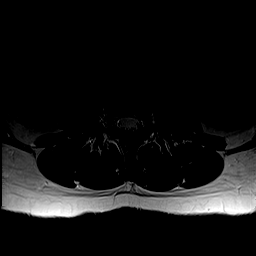
[im 21/37]
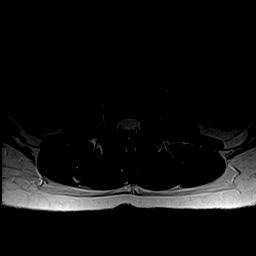
[im 24/37]
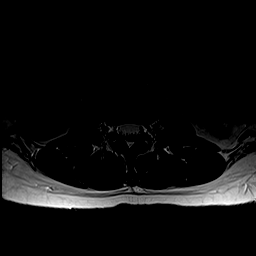
[im 26/37]
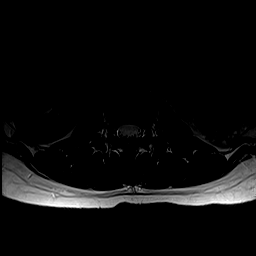
[im 29/37]
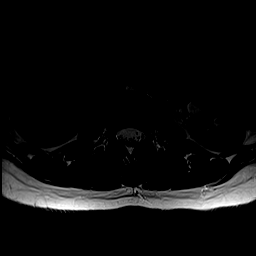
[im 31/37]
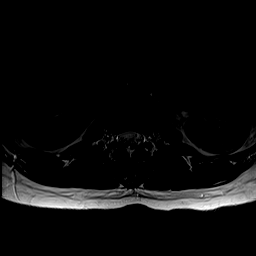
[im 34/37]
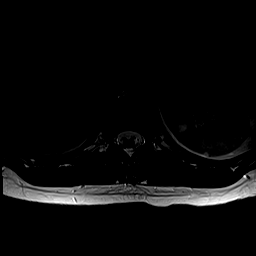
[im 37/37]
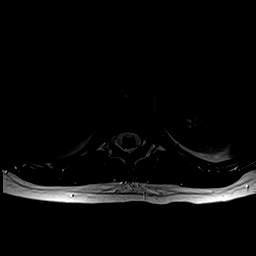

[Series 6: T1 · axial · 4.0mm · 0.78mm/px · z∈[-87,+137]mm · 12 of 37 slices shown (2 of 2)]
[im 1/37]
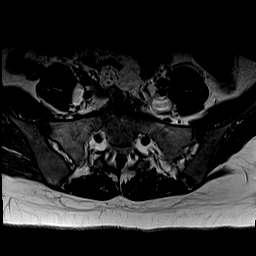
[im 3/37]
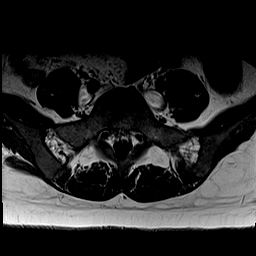
[im 6/37]
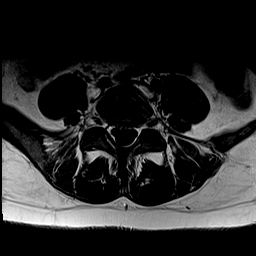
[im 8/37]
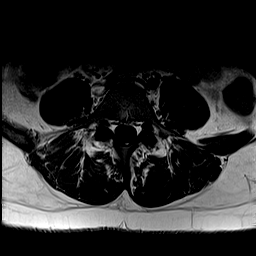
[im 11/37]
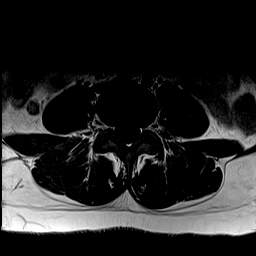
[im 13/37]
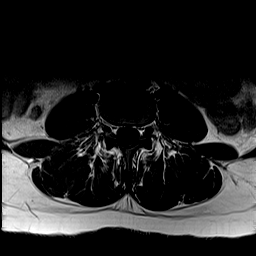
[im 16/37]
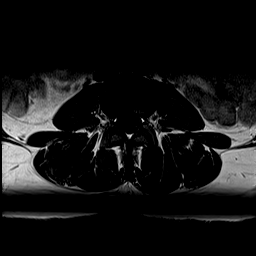
[im 19/37]
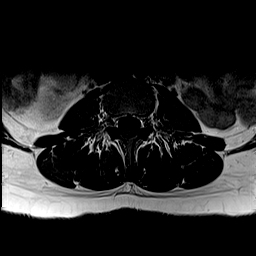
[im 21/37]
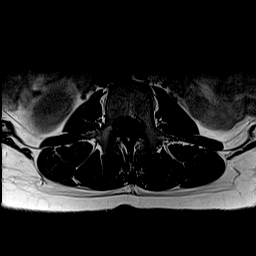
[im 26/37]
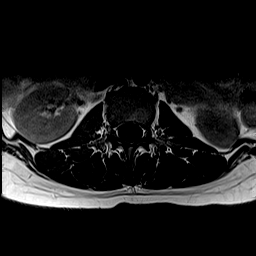
[im 31/37]
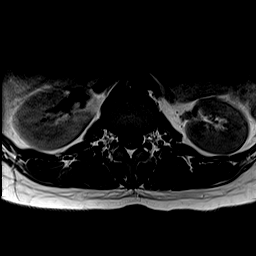
[im 37/37]
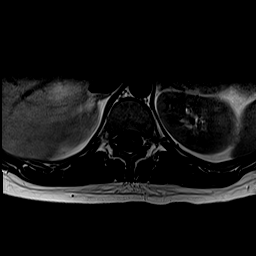

[45 of 48 positions shown; findings below may reference images not displayed]

FINDINGS: Segmentation:  Standard.

Alignment:  Physiologic.

Vertebrae:  No fracture, evidence of discitis, or bone lesion.

Conus medullaris and cauda equina: Conus extends to the L1-2 level.
Conus and cauda equina appear normal.

Paraspinal and other soft tissues: Multiple tiny cysts in left
kidney.

Disc levels:

L1-2: No significant disc displacement, foraminal stenosis, or canal
stenosis.

L2-3: No significant disc displacement, foraminal stenosis, or canal
stenosis.

L3-4: No significant disc displacement, foraminal stenosis, or canal
stenosis.

L4-5: Minimal disc bulge. No significant foraminal or canal
stenosis.

L5-S1: Small disc bulge eccentric to the left with mild left
foraminal stenosis. No significant canal stenosis.
IMPRESSION: 1. No acute osseous abnormality or malalignment.
2. Small disc bulges at L4-5 and L5-S1 level. Mild left L5-S1
foraminal stenosis. No significant canal stenosis.
3. Multiple tiny cysts in the left kidney.

By: Pirbacosse Ettia M.D.

## 2018-08-26 ENCOUNTER — Other Ambulatory Visit: Payer: Self-pay | Admitting: Radiology

## 2018-08-26 DIAGNOSIS — R9389 Abnormal findings on diagnostic imaging of other specified body structures: Secondary | ICD-10-CM

## 2019-11-10 ENCOUNTER — Ambulatory Visit: Payer: Medicaid Other

## 2019-11-15 ENCOUNTER — Ambulatory Visit: Payer: Medicaid Other | Attending: Internal Medicine

## 2019-11-15 DIAGNOSIS — Z23 Encounter for immunization: Secondary | ICD-10-CM

## 2019-11-15 NOTE — Progress Notes (Signed)
   Covid-19 Vaccination Clinic  Name:  Beth Holt    MRN: [id number] DOB: 13-May-1986  11/15/2019  [name] was observed post Covid-19 immunization for 15 minutes without incident. She was provided with Vaccine Information Sheet and instruction to access the V-Safe system.   [name] was instructed to call 911 with any severe reactions post vaccine: Marland Kitchen Difficulty breathing  . Swelling of face and throat  . A fast heartbeat  . A bad rash all over body  . Dizziness and weakness   Immunizations Administered    Name Date Dose VIS Date Route   Pfizer COVID-19 Vaccine 11/15/2019  2:09 PM 0.3 mL 07/25/2019 Intramuscular   Manufacturer: [employer], [employer]   Lot: [id number]   NDC: [address]-1

## 2019-12-09 ENCOUNTER — Ambulatory Visit: Payer: Medicaid Other | Attending: Internal Medicine

## 2019-12-09 DIAGNOSIS — Z23 Encounter for immunization: Secondary | ICD-10-CM

## 2019-12-09 NOTE — Progress Notes (Signed)
   Covid-19 Vaccination Clinic  Name:  KATHRYNE RAMELLA    MRN: [id number] DOB: 02/27/1986  12/09/2019  Ms. Meda was observed post Covid-19 immunization for 30 minutes based on pre-vaccination screening without incident. She was provided with Vaccine Information Sheet and instruction to access the V-Safe system.   Ms. Cathy was instructed to call 911 with any severe reactions post vaccine: Marland Kitchen Difficulty breathing  . Swelling of face and throat  . A fast heartbeat  . A bad rash all over body  . Dizziness and weakness   Immunizations Administered    Name Date Dose VIS Date Route   Pfizer COVID-19 Vaccine 12/09/2019  2:04 PM 0.3 mL 10/08/2018 Intramuscular   Manufacturer: [employer], [employer]   Lot: [id number]   NDC: [address]-5

## 2020-03-29 ENCOUNTER — Encounter: Payer: Self-pay | Admitting: Psychiatry

## 2020-03-29 ENCOUNTER — Other Ambulatory Visit: Payer: Self-pay

## 2020-03-29 ENCOUNTER — Ambulatory Visit (INDEPENDENT_AMBULATORY_CARE_PROVIDER_SITE_OTHER): Payer: 59 | Admitting: Psychiatry

## 2020-03-29 DIAGNOSIS — F320 Major depressive disorder, single episode, mild: Secondary | ICD-10-CM | POA: Diagnosis not present

## 2020-03-29 NOTE — Progress Notes (Signed)
[facility] Initial Adult Exam  Name: Beth Holt Date: 03/29/2020 MRN: [id number] DOB: 01-16-1986 PCP: Beth Fairly, MD (Inactive)  Time spent: 56 minutes   Guardian/Payee:  None    Paperwork requested:  No   Reason for Visit /Presenting Problem: This is a 34 year old single white female who comes in with complaints about sadness, depression and anxiety.  She states she had been doing Holt well up until December 2019 when her grandmother had a stroke and then a heart attack and died.  The client stated that her dad leaned on her a lot.  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  "It was all too much.  I had a mental breakdown and lost my job ".  During this time the client had an IUD that had come loose.  It caused a lot of bleeding.  She had a second one inserted but found out it was being expelled by her uterus.  In addition she had an ovarian cyst.  When she was in the gynecologist's office getting her second IUD removed the client noticed a spot on her breast.  The client is at high risk for breast cancer because her mother had breast cancer at age 50.  The client was immediately sent to an oncologist at [facility].  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  It was incredibly stressful for the client. In the midst of this, she met a guy and started dating.  They hit it off very well.  By March 2020 the client had traveled to [place] to meet his family.  As they were returning from [place] the [facility] lockdowns began.  When the client returned to [facility] her mother would not let her live with her because she was high risk because of her past cancer history and the mother was afraid of the COVID-19 exposure from the client.  The client did not have COVID-19 and ended up living with the boyfriend.  The  client then got pregnant.  "My boyfriend freaked out."  The client found all of this very confusing and emotionally overwhelming.  She was getting a lot of input from a lot of people.  She was told she had to get married, or give the baby up for adoption, or get an abortion, or raise the child herself, and many other options.  Her boyfriend was supportive but he leans toward having an abortion.  [facility] that would cause her to miscarry.  It turned out she was too many weeks along and opted to have the abortion.  "I was balling my eyes out the whole time.  I regret it so much.  I hate myself."  8 weeks later the client discovered that she was still pregnant because she had an incomplete abortion.  "I had to go back and have it done again."  Since then the client states she has gained 25 pounds and now has very high anxiety in addition to being depressed.  She takes 100 mg of Zoloft for PMDD and she also takes Ambien.  "I do not sleep right."   In June 2020 the client moved to [place] and got a two bedroom apartment.  Her boyfriend moved in  with her.  In August of this year the boyfriend broke up with her but continued to live in the apartment.  She states they have an odd arrangement but it seems to work for them.  The client also reports that her parents have recently separated and are divorcing.  They are selling her childhood home.  She has issues with her mom who is very narcissistic and her dad who has been inconsistent. Goals 1) reduce the high anxiety the client has. 2) reduce the client's sadness. 3) work through the trauma related to the abortion that she had. 4) work through the confusion of all the events that have occurred in the last year and a half. 5) resolve the conundrum she feels with her boyfriend. The client has seen me before.  She would like to pursue the EMDR which she found to work very well for her.   Mental Status Exam:    Appearance:   Casual     Behavior:  Appropriate  Motor:  Normal  Speech/Language:   Clear and Coherent  Affect:  Tearful  Mood:  anxious, depressed and sad  Thought process:  normal  Thought content:    WNL  Sensory/Perceptual disturbances:    WNL  Orientation:  oriented to person, place, time/date and situation  Attention:  Good  Concentration:  Good  Memory:  WNL  Fund of knowledge:   Good  Insight:    Good  Judgment:   Good  Impulse Control:  Good   Reported Symptoms: Anxiety, sadness, depressed mood, fatigue, poor sleep, weight gain.  Risk Assessment: Danger to Self:  No Self-injurious Behavior: No Danger to Others: No Duty to Warn:no Physical Aggression / Violence:No  Access to Firearms a concern: No  Gang Involvement:No  Patient / guardian was educated about steps to take if suicide or homicide risk level increases between visits: yes While future psychiatric events cannot be accurately predicted, the patient does not currently require acute inpatient psychiatric care and does not currently meet [facility] involuntary commitment criteria.  Substance Abuse History: Current substance abuse: No     Past Psychiatric History:   Previous psychological history is significant for anxiety and depression Outpatient Providers:Beth Holt [facility] History of Psych Hospitalization: No  Psychological Testing: None   Abuse History: Victim of No., sexual and NA   Report needed: No. Victim of Neglect:No. Perpetrator of NA  Witness / Exposure to Domestic Violence: No   Protective Services Involvement: No  Witness to [employer] Violence:  No   Family History:  Family History  Problem Relation Age of Onset  . Breast cancer Mother   . Colon cancer Neg Hx   . Multiple sclerosis Neg Hx     Living situation: the patient lives with an adult companion  Sexual Orientation:  Straight  Relationship Status: co-habitating  Name of spouse / other:Beth Holt             If a  parent, number of children / ages:NA  Support Systems; significant other friends  Financial Stress:  No   Income/Employment/Disability: Employment  Armed forces logistics/support/administrative officer: No   Educational History: Education: Scientist, product/process development:   [employer]  Any cultural differences that Terris Bodin affect / interfere with treatment:  not applicable   Recreation/Hobbies: music  Stressors:Health problems Traumatic event  Strengths:  Supportive Relationships, Friends, Hopefulness and Self Advocate  Barriers:  None   Legal History: Pending legal issue / charges: The patient has no significant history of legal issues.  History of legal issue / charges: NA  Medical History/Surgical History:not reviewed Past Medical History:  Diagnosis Date  . ADD (attention deficit disorder)   . Anxiety   . GERD (gastroesophageal reflux disease)   . Hypertension   . Lumbar radiculitis   . Migraines   . Multiple neurological symptoms   . PTSD (post-traumatic stress disorder)     Past Surgical History:  Procedure Laterality Date  . BACTERIAL OVERGROWTH TEST N/A 11/22/2012   Procedure: BACTERIAL OVERGROWTH TEST;  Surgeon: Daneil Dolin, MD;  Location: [facility];  Service: Endoscopy;  Laterality: N/A;  7:30  . COLONOSCOPY WITH ESOPHAGOGASTRODUODENOSCOPY (EGD) N/A 10/31/2012   Procedure: COLONOSCOPY WITH ESOPHAGOGASTRODUODENOSCOPY (EGD);  Surgeon: Daneil Dolin, MD;  Location: [facility];  Service: Endoscopy;  Laterality: N/A;  9:45  . TONSILLECTOMY  2012  . WISDOM TOOTH EXTRACTION      Medications: Current Outpatient Medications  Medication Sig Dispense Refill  . ALPRAZolam (XANAX) 0.25 MG tablet Take 1-2 tabs 30-60 minutes before MRI. Roark Rufo repeat right before MRI if needed. Do not drive. 4 tablet 0  . celecoxib (CELEBREX) 200 MG capsule Take 300 mg by mouth daily.     Marland Kitchen gabapentin (NEURONTIN) 300 MG capsule Take 300 mg by mouth at bedtime.     Marland Kitchen lisdexamfetamine (VYVANSE) 40  MG capsule Take 40 mg by mouth every morning.    . methocarbamol (ROBAXIN) 750 MG tablet Take 750-1,500 mg by mouth every 8 (eight) hours as needed for muscle spasms.    Marland Kitchen zolpidem (AMBIEN CR) 12.5 MG CR tablet Take 12.5 mg by mouth at bedtime.     No current facility-administered medications for this visit.   Facility-Administered Medications Ordered in Other Visits  Medication Dose Route Frequency Provider Last Rate Last Admin  . gadopentetate dimeglumine (MAGNEVIST) injection 15 mL  15 mL Intravenous Once PRN Melvenia Beam, MD        Allergies  Allergen Reactions  . Lithium Anaphylaxis and Other (See Comments)    Patient describes hemiparesis-type reaction Patient describes hemiparesis-type reaction   . Aspirin   . Lactose Intolerance (Gi)   . Latex Nausea And Vomiting  . Morphine And Related Other (See Comments)    "sick as a dog"  . Tylenol [Acetaminophen]     Diagnoses:    ICD-10-CM   1. 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  We will also restructure her negative thoughts around all of the past events of the last year and a half.  We will also neutralize the negative emotional content connected to it.  I will work on teaching the client new skills and problem-solving skills.  Estimated length of treatment 8-15 sessions.   Normon Pettijohn, [facility]

## 2020-04-09 ENCOUNTER — Ambulatory Visit: Payer: 59 | Admitting: Psychiatry

## 2020-04-20 ENCOUNTER — Ambulatory Visit: Payer: 59 | Admitting: Psychiatry

## 2020-05-07 ENCOUNTER — Emergency Department (HOSPITAL_COMMUNITY)
Admission: EM | Admit: 2020-05-07 | Discharge: 2020-05-07 | Disposition: A | Discharge status: Home / Self Care | Payer: 59 | Attending: Emergency Medicine | Admitting: Emergency Medicine

## 2020-05-07 ENCOUNTER — Encounter (HOSPITAL_COMMUNITY): Payer: Self-pay | Admitting: Emergency Medicine

## 2020-05-07 ENCOUNTER — Emergency Department (HOSPITAL_COMMUNITY): Payer: 59

## 2020-05-07 DIAGNOSIS — S63501A Unspecified sprain of right wrist, initial encounter: Secondary | ICD-10-CM | POA: Insufficient documentation

## 2020-05-07 DIAGNOSIS — X501XXA Overexertion from prolonged static or awkward postures, initial encounter: Secondary | ICD-10-CM | POA: Insufficient documentation

## 2020-05-07 DIAGNOSIS — S6991XA Unspecified injury of right wrist, hand and finger(s), initial encounter: Secondary | ICD-10-CM | POA: Diagnosis present

## 2020-05-07 DIAGNOSIS — Z91040 Latex allergy status: Secondary | ICD-10-CM | POA: Insufficient documentation

## 2020-05-07 DIAGNOSIS — F17200 Nicotine dependence, unspecified, uncomplicated: Secondary | ICD-10-CM | POA: Diagnosis not present

## 2020-05-07 DIAGNOSIS — I10 Essential (primary) hypertension: Secondary | ICD-10-CM | POA: Insufficient documentation

## 2020-05-07 MED ORDER — KETOROLAC TROMETHAMINE 60 MG/2ML IM SOLN
60.0000 mg | Freq: Once | INTRAMUSCULAR | Status: AC
  Administered 2020-05-07: 60 mg via INTRAMUSCULAR
  Filled 2020-05-07: qty 2

## 2020-05-07 NOTE — Discharge Instructions (Addendum)
Thank you for letting me take care of you in the ED today.  Your x-rays did not show any evidence of fracture or dislocations.  Please take 800 mg of ibuprofen up to 3 times daily for pain.  Please wear the wrist splint as much as possible.  Please make sure to call [facility] today to schedule an appointment.  Return to the ER for any new or worsening symptoms.

## 2020-05-07 NOTE — ED Provider Notes (Signed)
[facility] EMERGENCY DEPARTMENT Provider Note   CSN: [id number] Arrival date & time: 05/07/20  0847     History Chief Complaint  Patient presents with  . Wrist Pain    Beth Holt is a 34 y.o. female.  HPI 34 year old female with a history of ADD, anxiety, GERD, hypertension, migraines, PTSD presents to the ER with complaints of right wrist pain.  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  She states that she tried to pull the patient's truck towards her and felt a pop in her wrist.  She then had a sudden onset of pain and some mild swelling.  Patient reports she has pain with thumb movement and wrist movement, however denies any numbness or tingling.  She denies any falls into the hand.  She is able to move all 5 fingers.    Past Medical History:  Diagnosis Date  . ADD (attention deficit disorder)   . Anxiety   . GERD (gastroesophageal reflux disease)   . Hypertension   . Lumbar radiculitis   . Migraines   . Multiple neurological symptoms   . PTSD (post-traumatic stress disorder)     Patient Active Problem List   Diagnosis Date Noted  . Multiple neurological symptoms 08/08/2017  . Bloating 10/28/2012  . Diarrhea 10/28/2012  . GERD (gastroesophageal reflux disease) 10/28/2012    Past Surgical History:  Procedure Laterality Date  . BACTERIAL OVERGROWTH TEST N/A 11/22/2012   Procedure: BACTERIAL OVERGROWTH TEST;  Surgeon: Corbin Ade, MD;  [facility];  Service: Endoscopy;  Laterality: N/A;  7:30  . COLONOSCOPY WITH ESOPHAGOGASTRODUODENOSCOPY (EGD) N/A 10/31/2012   Procedure: COLONOSCOPY WITH ESOPHAGOGASTRODUODENOSCOPY (EGD);  Surgeon: Corbin Ade, MD;  Location: [facility];  Service: Endoscopy;  Laterality: N/A;  9:45  . TONSILLECTOMY  2012  . WISDOM TOOTH EXTRACTION       OB History   No obstetric history on file.     Family History  Problem Relation Age of Onset  . Breast cancer Mother    . Colon cancer Neg Hx   . Multiple sclerosis Neg Hx     Social History   Tobacco Use  . Smoking status: Current Every Day Smoker    Packs/day: 0.50    Years: 5.00    Pack years: 2.50  . Smokeless tobacco: Never Used  Substance Use Topics  . Alcohol use: No  . Drug use: No    Home Medications Prior to Admission medications   Medication Sig Start Date End Date Taking? Authorizing Provider  ALPRAZolam (XANAX) 0.25 MG tablet Take 1-2 tabs 30-60 minutes before MRI. May repeat right before MRI if needed. Do not drive. 08/08/17   Anson Fret, MD  celecoxib (CELEBREX) 200 MG capsule Take 300 mg by mouth daily.     [provider]  gabapentin (NEURONTIN) 300 MG capsule Take 300 mg by mouth at bedtime.     [provider]  lisdexamfetamine (VYVANSE) 40 MG capsule Take 40 mg by mouth every morning.    [provider]  methocarbamol (ROBAXIN) 750 MG tablet Take 750-1,500 mg by mouth every 8 (eight) hours as needed for muscle spasms.    [provider]  zolpidem (AMBIEN CR) 12.5 MG CR tablet Take 12.5 mg by mouth at bedtime.    [provider]    Allergies    Lithium, Aspirin, Lactose intolerance (gi), Latex, Morphine and related, and Tylenol [acetaminophen]  Review of Systems   Review of Systems  Musculoskeletal: Positive for arthralgias and joint swelling.  Neurological: Negative for weakness and numbness.    Physical Exam Updated Vital Signs BP 119/67 (BP Location: Left Arm)   Pulse 79   Temp 97.9 F (36.6 C) (Oral)   Resp 16   LMP 04/28/2020   SpO2 100%   Physical Exam Vitals reviewed.  Constitutional:      General: She is not in acute distress.    Appearance: Normal appearance. She is not ill-appearing or diaphoretic.  HENT:     Head: Normocephalic and atraumatic.  Eyes:     General:        Right eye: No discharge.        Left eye: No discharge.     Extraocular Movements: Extraocular movements intact.      Conjunctiva/sclera: Conjunctivae normal.  Musculoskeletal:        General: Tenderness and signs of injury present. No swelling. Normal range of motion.     Right wrist: Tenderness present.       Hands:     Comments: Right wrist with small hard raised area medial to the radius, mildly tender to palpation.  No visible erythema, swelling, fluctuance, drainage.  No snuffbox tenderness.  Patient does report pain with thumb movement.  Limited range of motion of wrist secondary to pain.  2+ radial pulses.  Gross sensations intact.  Grip strength decreased secondary to pain. <2 cap refill   Neurological:     General: No focal deficit present.     Mental Status: She is alert and oriented to person, place, and time.     Sensory: No sensory deficit.     Motor: Weakness present.  Psychiatric:        Mood and Affect: Mood normal.        Behavior: Behavior normal.     ED Results / Procedures / Treatments   Labs (all labs ordered are listed, but only abnormal results are displayed) Labs Reviewed - No data to display  EKG None  Radiology DG Wrist Complete Right  Result Date: 05/07/2020 CLINICAL DATA:  Pain and swelling. EXAM: RIGHT WRIST - COMPLETE 3+ VIEW COMPARISON:  No prior. FINDINGS: No acute bony or joint abnormality. No evidence of fracture dislocation. No radiopaque foreign body. IMPRESSION: No acute abnormality. Electronically Signed   By: Maisie Fus  Register   On: 05/07/2020 09:14    Procedures Procedures (including critical care time)  Medications Ordered in ED Medications  ketorolac (TORADOL) injection 60 mg (has no administration in time range)    ED Course  I have reviewed the triage vital signs and the nursing notes.  Pertinent labs & imaging results that were available during my care of the patient were reviewed by me and considered in my medical decision making (see chart for details).    MDM Rules/Calculators/A&P                         34 year old female with right  wrist pain after moving a patient On presentation, she does have a raised lump just medial to the radial aspect of the wrist on the ventral side.  No snuffbox tenderness, limited range of motion of the thumb secondary to pain.  Neurovascularly intact.  No evidence of open fracture.  Plain films without acute abnormalities.  Will place in a thumb spica splint, Toradol here in the ED.  Will send home with ibuprofen and Ortho follow-up.  Return precautions discussed.  Patient voiced understanding and is agreeable.  At this stage in the ED course, the patient has been medically screened and stable for discharge.  Final Clinical Impression(s) / ED Diagnoses Final diagnoses:  Sprain of right wrist, initial encounter    Rx / DC Orders ED Discharge Orders    None       Mare Ferrari, PA-C 05/07/20 4585    Gwyneth Sprout, MD 05/14/20 2210

## 2020-05-07 NOTE — ED Notes (Signed)
Patient verbalizes understanding of discharge instructions. Opportunity for questioning and answers were provided. Armband removed by staff, pt discharged from ED but left prior to vitals being completed.

## 2020-05-07 NOTE — Progress Notes (Signed)
Orthopedic Tech Progress Note Patient Details:  STEELE LEDONNE 01-Dec-1985 [id number]  Ortho Devices Type of Ortho Device: Thumb velcro splint Ortho Device/Splint Location: RUE Ortho Device/Splint Interventions: Ordered, Application   Post Interventions Patient Tolerated: Well Instructions Provided: Care of device, Adjustment of device, Poper ambulation with device   Ramia Sidney 05/07/2020, 10:52 AM

## 2020-05-07 NOTE — ED Notes (Signed)
Patient transported to X-ray 

## 2020-05-07 NOTE — ED Notes (Signed)
Ortho tech called for thumb spica placement 

## 2020-05-07 NOTE — ED Triage Notes (Signed)
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

## 2020-05-17 ENCOUNTER — Ambulatory Visit: Payer: 59 | Admitting: Psychiatry

## 2020-05-27 ENCOUNTER — Ambulatory Visit: Payer: 59 | Admitting: Psychiatry

## 2020-05-31 ENCOUNTER — Ambulatory Visit: Payer: 59 | Admitting: Psychiatry

## 2020-06-04 ENCOUNTER — Ambulatory Visit: Payer: 59 | Admitting: Psychiatry

## 2020-06-08 ENCOUNTER — Ambulatory Visit: Payer: 59 | Admitting: Psychiatry

## 2020-06-08 ENCOUNTER — Other Ambulatory Visit: Payer: Self-pay

## 2020-06-08 ENCOUNTER — Encounter (HOSPITAL_BASED_OUTPATIENT_CLINIC_OR_DEPARTMENT_OTHER): Payer: Self-pay | Admitting: Orthopaedic Surgery

## 2020-06-10 ENCOUNTER — Other Ambulatory Visit (HOSPITAL_COMMUNITY): Payer: Medicaid Other

## 2020-06-11 ENCOUNTER — Ambulatory Visit: Payer: 59 | Admitting: Psychiatry

## 2020-06-12 ENCOUNTER — Other Ambulatory Visit (HOSPITAL_COMMUNITY)
Admission: RE | Admit: 2020-06-12 | Discharge: 2020-06-12 | Disposition: A | Discharge status: Home / Self Care | Payer: Medicaid Other | Source: Ambulatory Visit | Attending: Orthopaedic Surgery | Admitting: Orthopaedic Surgery

## 2020-06-12 DIAGNOSIS — Z01812 Encounter for preprocedural laboratory examination: Secondary | ICD-10-CM | POA: Diagnosis not present

## 2020-06-12 DIAGNOSIS — Z20822 Contact with and (suspected) exposure to covid-19: Secondary | ICD-10-CM | POA: Insufficient documentation

## 2020-06-13 LAB — SARS CORONAVIRUS 2 (TAT 6-24 HRS): SARS Coronavirus 2: NEGATIVE

## 2020-06-14 ENCOUNTER — Encounter (HOSPITAL_BASED_OUTPATIENT_CLINIC_OR_DEPARTMENT_OTHER): Payer: Self-pay | Admitting: Orthopaedic Surgery

## 2020-06-14 ENCOUNTER — Encounter (HOSPITAL_BASED_OUTPATIENT_CLINIC_OR_DEPARTMENT_OTHER)
Admission: RE | Disposition: A | Discharge status: Home / Self Care | Payer: Self-pay | Source: Home / Self Care | Attending: Orthopaedic Surgery

## 2020-06-14 ENCOUNTER — Ambulatory Visit (HOSPITAL_BASED_OUTPATIENT_CLINIC_OR_DEPARTMENT_OTHER)
Admission: RE | Admit: 2020-06-14 | Discharge: 2020-06-14 | Disposition: A | Discharge status: Home / Self Care | Payer: No Typology Code available for payment source | Attending: Orthopaedic Surgery | Admitting: Orthopaedic Surgery

## 2020-06-14 ENCOUNTER — Other Ambulatory Visit: Payer: Self-pay

## 2020-06-14 ENCOUNTER — Ambulatory Visit (HOSPITAL_BASED_OUTPATIENT_CLINIC_OR_DEPARTMENT_OTHER): Payer: No Typology Code available for payment source | Admitting: Anesthesiology

## 2020-06-14 DIAGNOSIS — M67431 Ganglion, right wrist: Secondary | ICD-10-CM | POA: Diagnosis not present

## 2020-06-14 DIAGNOSIS — Z87891 Personal history of nicotine dependence: Secondary | ICD-10-CM | POA: Diagnosis not present

## 2020-06-14 HISTORY — PX: GANGLION CYST EXCISION: SHX1691

## 2020-06-14 HISTORY — DX: Attention-deficit hyperactivity disorder, unspecified type: F90.9

## 2020-06-14 HISTORY — DX: Other chronic pain: G89.29

## 2020-06-14 LAB — POCT PREGNANCY, URINE: Preg Test, Ur: NEGATIVE

## 2020-06-14 SURGERY — EXCISION, GANGLION CYST, WRIST
Anesthesia: Monitor Anesthesia Care | Site: Wrist | Laterality: Right

## 2020-06-14 MED ORDER — BUPIVACAINE-EPINEPHRINE (PF) 0.5% -1:200000 IJ SOLN
INTRAMUSCULAR | Status: DC | PRN
  Administered 2020-06-14: 30 mL via PERINEURAL

## 2020-06-14 MED ORDER — LIDOCAINE HCL (CARDIAC) PF 100 MG/5ML IV SOSY
PREFILLED_SYRINGE | INTRAVENOUS | Status: DC | PRN
  Administered 2020-06-14: 60 mg via INTRAVENOUS

## 2020-06-14 MED ORDER — KETOROLAC TROMETHAMINE 30 MG/ML IJ SOLN
INTRAMUSCULAR | Status: AC
  Filled 2020-06-14: qty 1

## 2020-06-14 MED ORDER — PROPOFOL 500 MG/50ML IV EMUL
INTRAVENOUS | Status: DC | PRN
  Administered 2020-06-14: 150 ug/kg/min via INTRAVENOUS

## 2020-06-14 MED ORDER — FENTANYL CITRATE (PF) 100 MCG/2ML IJ SOLN
INTRAMUSCULAR | Status: AC
  Filled 2020-06-14: qty 2

## 2020-06-14 MED ORDER — MIDAZOLAM HCL 5 MG/5ML IJ SOLN
INTRAMUSCULAR | Status: DC | PRN
  Administered 2020-06-14: 2 mg via INTRAVENOUS

## 2020-06-14 MED ORDER — FENTANYL CITRATE (PF) 100 MCG/2ML IJ SOLN
INTRAMUSCULAR | Status: DC | PRN
  Administered 2020-06-14: 50 ug via INTRAVENOUS

## 2020-06-14 MED ORDER — DIPHENHYDRAMINE HCL 50 MG/ML IJ SOLN
INTRAMUSCULAR | Status: DC | PRN
  Administered 2020-06-14: 25 mg via INTRAVENOUS

## 2020-06-14 MED ORDER — PROPOFOL 500 MG/50ML IV EMUL
INTRAVENOUS | Status: AC
  Filled 2020-06-14: qty 50

## 2020-06-14 MED ORDER — LACTATED RINGERS IV SOLN: INTRAVENOUS | Status: DC

## 2020-06-14 MED ORDER — SCOPOLAMINE 1 MG/3DAYS TD PT72
MEDICATED_PATCH | TRANSDERMAL | Status: AC
  Filled 2020-06-14: qty 1

## 2020-06-14 MED ORDER — CEFAZOLIN SODIUM-DEXTROSE 2-4 GM/100ML-% IV SOLN
INTRAVENOUS | Status: AC
  Filled 2020-06-14: qty 100

## 2020-06-14 MED ORDER — KETOROLAC TROMETHAMINE 30 MG/ML IJ SOLN
INTRAMUSCULAR | Status: DC | PRN
  Administered 2020-06-14: 30 mg via INTRAVENOUS

## 2020-06-14 MED ORDER — ONDANSETRON HCL 4 MG/2ML IJ SOLN
INTRAMUSCULAR | Status: DC | PRN
  Administered 2020-06-14: 4 mg via INTRAVENOUS

## 2020-06-14 MED ORDER — MIDAZOLAM HCL 2 MG/2ML IJ SOLN
INTRAMUSCULAR | Status: AC
  Filled 2020-06-14: qty 2

## 2020-06-14 MED ORDER — FENTANYL CITRATE (PF) 100 MCG/2ML IJ SOLN
100.0000 ug | Freq: Once | INTRAMUSCULAR | Status: AC
  Administered 2020-06-14: 100 ug via INTRAVENOUS

## 2020-06-14 MED ORDER — LIDOCAINE 2% (20 MG/ML) 5 ML SYRINGE
INTRAMUSCULAR | Status: AC
  Filled 2020-06-14: qty 5

## 2020-06-14 MED ORDER — CLONIDINE HCL (ANALGESIA) 100 MCG/ML EP SOLN
EPIDURAL | Status: DC | PRN
  Administered 2020-06-14: 50 ug

## 2020-06-14 MED ORDER — GABAPENTIN 300 MG PO CAPS: 300.0000 mg | ORAL_CAPSULE | Freq: Once | ORAL | Status: DC

## 2020-06-14 MED ORDER — ONDANSETRON HCL 4 MG/2ML IJ SOLN
INTRAMUSCULAR | Status: AC
  Filled 2020-06-14: qty 2

## 2020-06-14 MED ORDER — CEFAZOLIN SODIUM-DEXTROSE 2-4 GM/100ML-% IV SOLN
2.0000 g | INTRAVENOUS | Status: AC
  Administered 2020-06-14: 2 g via INTRAVENOUS

## 2020-06-14 MED ORDER — MIDAZOLAM HCL 2 MG/2ML IJ SOLN
2.0000 mg | Freq: Once | INTRAMUSCULAR | Status: AC
  Administered 2020-06-14: 2 mg via INTRAVENOUS

## 2020-06-14 SURGICAL SUPPLY — 48 items
BLADE SURG 15 STRL LF DISP TIS (BLADE) ×2 IMPLANT
BLADE SURG 15 STRL SS (BLADE) ×4
BNDG CMPR 9X4 STRL LF SNTH (GAUZE/BANDAGES/DRESSINGS) ×1
BNDG CONFORM 2 STRL LF (GAUZE/BANDAGES/DRESSINGS) IMPLANT
BNDG ELASTIC 3X5.8 VLCR STR LF (GAUZE/BANDAGES/DRESSINGS) ×2 IMPLANT
BNDG ESMARK 4X9 LF (GAUZE/BANDAGES/DRESSINGS) ×2 IMPLANT
BNDG GAUZE ELAST 4 BULKY (GAUZE/BANDAGES/DRESSINGS) ×1 IMPLANT
CANISTER SUCT 1200ML W/VALVE (MISCELLANEOUS) ×2 IMPLANT
CORD BIPOLAR FORCEPS 12FT (ELECTRODE) ×2 IMPLANT
COVER BACK TABLE 60X90IN (DRAPES) ×2 IMPLANT
COVER WAND RF STERILE (DRAPES) IMPLANT
CUFF TOURN SGL QUICK 18X4 (TOURNIQUET CUFF) ×1 IMPLANT
DECANTER SPIKE VIAL GLASS SM (MISCELLANEOUS) IMPLANT
DRAPE EXTREMITY T 121X128X90 (DISPOSABLE) ×2 IMPLANT
DRAPE IMP U-DRAPE 54X76 (DRAPES) ×2 IMPLANT
DRAPE SURG 17X23 STRL (DRAPES) ×2 IMPLANT
GAUZE 4X4 16PLY RFD (DISPOSABLE) IMPLANT
GAUZE XEROFORM 1X8 LF (GAUZE/BANDAGES/DRESSINGS) ×2 IMPLANT
GLOVE BIOGEL PI IND STRL 7.0 (GLOVE) IMPLANT
GLOVE BIOGEL PI IND STRL 8 (GLOVE) IMPLANT
GLOVE BIOGEL PI INDICATOR 7.0 (GLOVE) ×2
GLOVE BIOGEL PI INDICATOR 8 (GLOVE)
GLOVE SURG SYN 7.5  E (GLOVE)
GLOVE SURG SYN 7.5 E (GLOVE) IMPLANT
GLOVE SURG SYN 7.5 PF PI (GLOVE) IMPLANT
GOWN STRL REUS W/ TWL LRG LVL3 (GOWN DISPOSABLE) ×2 IMPLANT
GOWN STRL REUS W/TWL LRG LVL3 (GOWN DISPOSABLE) ×4
NDL HYPO 25X1 1.5 SAFETY (NEEDLE) ×1 IMPLANT
NEEDLE HYPO 22GX1.5 SAFETY (NEEDLE) IMPLANT
NEEDLE HYPO 25X1 1.5 SAFETY (NEEDLE) ×2 IMPLANT
NS IRRIG 1000ML POUR BTL (IV SOLUTION) ×2 IMPLANT
PACK BASIN DAY SURGERY FS (CUSTOM PROCEDURE TRAY) ×2 IMPLANT
PADDING CAST ABS 3INX4YD NS (CAST SUPPLIES) ×1
PADDING CAST ABS 4INX4YD NS (CAST SUPPLIES) ×1
PADDING CAST ABS COTTON 3X4 (CAST SUPPLIES) ×1 IMPLANT
PADDING CAST ABS COTTON 4X4 ST (CAST SUPPLIES) ×1 IMPLANT
SHEET MEDIUM DRAPE 40X70 STRL (DRAPES) ×2 IMPLANT
SLEEVE SCD COMPRESS KNEE MED (MISCELLANEOUS) ×1 IMPLANT
SPLINT FIBERGLASS 3X35 (CAST SUPPLIES) ×1 IMPLANT
STOCKINETTE SYNTHETIC 3 UNSTER (CAST SUPPLIES) ×2 IMPLANT
SUCTION FRAZIER HANDLE 10FR (MISCELLANEOUS)
SUCTION TUBE FRAZIER 10FR DISP (MISCELLANEOUS) IMPLANT
SUT PROLENE 4 0 PS 2 18 (SUTURE) ×2 IMPLANT
SYR BULB EAR ULCER 3OZ GRN STR (SYRINGE) ×2 IMPLANT
SYR CONTROL 10ML LL (SYRINGE) ×2 IMPLANT
TOWEL GREEN STERILE FF (TOWEL DISPOSABLE) ×4 IMPLANT
TUBE CONNECTING 20X1/4 (TUBING) IMPLANT
UNDERPAD 30X36 HEAVY ABSORB (UNDERPADS AND DIAPERS) ×2 IMPLANT

## 2020-06-14 NOTE — H&P (Signed)
ORTHOPAEDIC H&P  PCP:  Laurell Josephs, MD (Inactive)  Chief Complaint: Right volar carpal ganglion  HPI: Beth Holt is a 34 y.o. female who complains of right wrist pain.  She is seen in clinic where she was found to have a right volar carpal ganglion.  This developed after an injury at work.  We discussed treatment options and she did wish to proceed forward with excision of the ganglion.  She presents today for removal of her right volar carpal ganglion.  Past Medical History:  Diagnosis Date  . ADD (attention deficit disorder)   . ADHD   . Anxiety   . Chronic back pain   . GERD (gastroesophageal reflux disease)   . Hypertension   . Lumbar radiculitis   . Migraines   . Multiple neurological symptoms   . PTSD (post-traumatic stress disorder)    Past Surgical History:  Procedure Laterality Date  . BACTERIAL OVERGROWTH TEST N/A 11/22/2012   Procedure: BACTERIAL OVERGROWTH TEST;  Surgeon: Corbin Ade, MD;  [facility];  Service: Endoscopy;  Laterality: N/A;  7:30  . COLONOSCOPY WITH ESOPHAGOGASTRODUODENOSCOPY (EGD) N/A 10/31/2012   Procedure: COLONOSCOPY WITH ESOPHAGOGASTRODUODENOSCOPY (EGD);  Surgeon: Corbin Ade, MD;  [facility];  Service: Endoscopy;  Laterality: N/A;  9:45  . TONSILLECTOMY  2012  . WISDOM TOOTH EXTRACTION     Social History   Socioeconomic History  . Marital status: Single    Spouse name: Not on file  . Number of children: Not on file  . Years of education: Not on file  . Highest education level: Not on file  Occupational History  . Occupation: Scientist, research (medical): [employer]    Comment: [employer]  Tobacco Use  . Smoking status: Former Smoker    Packs/day: 0.50    Years: 5.00    Pack years: 2.50  . Smokeless tobacco: Never Used  Vaping Use  . Vaping Use: Some days  Substance and Sexual Activity  . Alcohol use: No  . Drug use: No  . Sexual activity: Yes    Birth control/protection: OCP  Other Topics Concern   . Not on file  Social History Narrative  . Not on file   Social Determinants of Health   Financial Resource Strain:   . Difficulty of Paying Living Expenses: Not on file  Food Insecurity:   . Worried About Programme researcher, broadcasting/film/video in the Last Year: Not on file  . Ran Out of Food in the Last Year: Not on file  Transportation Needs:   . Lack of Transportation (Medical): Not on file  . Lack of Transportation (Non-Medical): Not on file  Physical Activity:   . Days of Exercise per Week: Not on file  . Minutes of Exercise per Session: Not on file  Stress:   . Feeling of Stress : Not on file  Social Connections:   . Frequency of Communication with Friends and Family: Not on file  . Frequency of Social Gatherings with Friends and Family: Not on file  . Attends Religious Services: Not on file  . Active Member of Clubs or Organizations: Not on file  . Attends Banker Meetings: Not on file  . Marital Status: Not on file   Family History  Problem Relation Age of Onset  . Breast cancer Mother   . Colon cancer Neg Hx   . Multiple sclerosis Neg Hx    Allergies  Allergen Reactions  .  Lithium Anaphylaxis and Other (See Comments)    Patient describes hemiparesis-type reaction Patient describes hemiparesis-type reaction   . Aspirin   . Lactose Intolerance (Gi)   . Latex Nausea And Vomiting  . Morphine And Related Other (See Comments)    "sick as a dog"  . Tylenol [Acetaminophen]    Prior to Admission medications   Medication Sig Start Date End Date Taking? Authorizing Provider  celecoxib (CELEBREX) 200 MG capsule Take 300 mg by mouth 2 (two) times daily.    Yes [provider]  gabapentin (NEURONTIN) 300 MG capsule Take 300 mg by mouth 3 (three) times daily.    Yes [provider]  HYDROmorphone (DILAUDID) 2 MG tablet Take 2 mg by mouth every 6 (six) hours as needed for severe pain.   Yes [provider]  lisdexamfetamine (VYVANSE) 40 MG capsule  Take 40 mg by mouth every morning.   Yes [provider]  sertraline (ZOLOFT) 100 MG tablet Take 100 mg by mouth daily.   Yes [provider]  zolpidem (AMBIEN CR) 12.5 MG CR tablet Take 12.5 mg by mouth at bedtime.   Yes [provider]   No results found.  Positive ROS: All other systems have been reviewed and were otherwise negative with the exception of those mentioned in the HPI and as above.  Physical Exam: General: Alert, no acute distress Cardiovascular: No edema Respiratory: No cyanosis, no use of accessory musculature Skin: No lesions in the area of chief complaint  Psychiatric: Patient is competent for consent with normal mood and affect  MUSCULOSKELETAL: Examination of the right upper extremity shows a grossly normal appearing right hand. There is no swelling, erythema or signs of infection. There is a well-defined lesion over the volar and radial aspect of the wrist. It is mildly tender and fluctuant. It is nonmobile within the soft tissues. It is directly overlying the radial artery. She has full range of motion of her wrist including flexion, extension, pronation and supination compared to the contralateral side. She does though have pain with full wrist flexion and extension. She has full range of motion of her digits. She has negative Finkelstein's maneuver. No tenderness at the anatomic snuffbox. She is mildly positive Tinel's and Phalen's maneuver to the median nerve.  Assessment: Right volar carpal ganglion  Plan: Plan to proceed forward with excision of the right volar carpal ganglion.  The risks, benefits and alternatives of the procedure were once again discussed with patient.  These risks include but not limited to infection, bleeding, damage to surrounding structures including blood vessels and nerves, pain, stiffness, recurrence and need for additional procedures.  Informed consent was obtained.  Patient's right wrist was marked with a  surgical marking pen.  Plan for discharge home postoperatively with follow-up with me in approximate 10 to 14 days.    Ernest Mallick, MD [phone]   06/14/2020 8:40 AM

## 2020-06-14 NOTE — Anesthesia Postprocedure Evaluation (Signed)
Anesthesia Post Note  Patient: Beth Holt  Procedure(s) Performed: Excision of right volar carpal ganglion and surgery as indicated (Right Wrist)     Patient location during evaluation: PACU Anesthesia Type: Regional Level of consciousness: awake and alert Pain management: pain level controlled Vital Signs Assessment: post-procedure vital signs reviewed and stable Respiratory status: spontaneous breathing, nonlabored ventilation, respiratory function stable and patient connected to nasal cannula oxygen Cardiovascular status: stable and blood pressure returned to baseline Postop Assessment: no apparent nausea or vomiting Anesthetic complications: no   No complications documented.  Last Vitals:  Vitals:   06/14/20 1015 06/14/20 1046  BP: 96/62 101/72  Pulse: (!) 54 71  Resp: 15 18  Temp:  36.6 C  SpO2: 96% 97%    Last Pain:  Vitals:   06/14/20 1046  TempSrc: Oral  PainSc: 0-No pain                 Kennieth Rad

## 2020-06-14 NOTE — Progress Notes (Signed)
Assisted Dr. Rob Fitzgerald with right, ultrasound guided, axillary block. Side rails up, monitors on throughout procedure. See vital signs in flow sheet. Tolerated Procedure well. 

## 2020-06-14 NOTE — Discharge Instructions (Signed)
Discharge Instructions  - Keep dressings in place. Do not remove them. - The dressings must stay dry - Take all medication as prescribed. Transition to over the counter pain medication as your pain improves - Keep the hand elevated over the next 48-72 hours to help with pain and swelling - Move all digits not restricted by the dressings regularly to prevent stiffness - Please call to schedule a follow up appointment with Dr. Creighton and therapy at [phone] for 10-14 days following surgery - Your pain medication have been sent digitally to your pharmacy   Post Anesthesia Home Care Instructions  Activity: Get plenty of rest for the remainder of the day. A responsible individual must stay with you for 24 hours following the procedure.  For the next 24 hours, DO NOT: -Drive a car -Operate machinery -Drink alcoholic beverages -Take any medication unless instructed by your physician -Make any legal decisions or sign important papers.  Meals: Start with liquid foods such as gelatin or soup. Progress to regular foods as tolerated. Avoid greasy, spicy, heavy foods. If nausea and/or vomiting occur, drink only clear liquids until the nausea and/or vomiting subsides. Call your physician if vomiting continues.  Special Instructions/Symptoms: Your throat may feel dry or sore from the anesthesia or the breathing tube placed in your throat during surgery. If this causes discomfort, gargle with warm salt water. The discomfort should disappear within 24 hours.  If you had a scopolamine patch placed behind your ear for the management of post- operative nausea and/or vomiting:  1. The medication in the patch is effective for 72 hours, after which it should be removed.  Wrap patch in a tissue and discard in the trash. Wash hands thoroughly with soap and water. 2. You may remove the patch earlier than 72 hours if you experience unpleasant side effects which may include dry mouth, dizziness or visual  disturbances. 3. Avoid touching the patch. Wash your hands with soap and water after contact with the patch.    Regional Anesthesia Blocks  1. Numbness or the inability to move the "blocked" extremity may last from 3-48 hours after placement. The length of time depends on the medication injected and your individual response to the medication. If the numbness is not going away after 48 hours, call your surgeon.  2. The extremity that is blocked will need to be protected until the numbness is gone and the  Strength has returned. Because you cannot feel it, you will need to take extra care to avoid injury. Because it may be weak, you may have difficulty moving it or using it. You may not know what position it is in without looking at it while the block is in effect.  3. For blocks in the legs and feet, returning to weight bearing and walking needs to be done carefully. You will need to wait until the numbness is entirely gone and the strength has returned. You should be able to move your leg and foot normally before you try and bear weight or walk. You will need someone to be with you when you first try to ensure you do not fall and possibly risk injury.  4. Bruising and tenderness at the needle site are common side effects and will resolve in a few days.  5. Persistent numbness or new problems with movement should be communicated to the surgeon or the [facility] ([phone])/ [facility] ([phone]). 

## 2020-06-14 NOTE — Anesthesia Procedure Notes (Signed)
Anesthesia Regional Block: Axillary brachial plexus block   Pre-Anesthetic Checklist: ,, timeout performed, Correct Patient, Correct Site, Correct Laterality, Correct Procedure, Correct Position, site marked, Risks and benefits discussed,  Surgical consent,  Pre-op evaluation,  At surgeon's request and post-op pain management  Laterality: Right  Prep: chloraprep       Needles:  Injection technique: Single-shot  Needle Type: Echogenic Needle     Needle Length: 9cm  Needle Gauge: 21     Additional Needles:   Procedures:, nerve stimulator,,, ultrasound used (permanent image in chart),,,,   Nerve Stimulator or Paresthesia:  Response: MC, median, ulnar and radial, 0.5 mA,   Additional Responses:   Narrative:  Start time: 06/14/2020 8:12 AM End time: 06/14/2020 8:18 AM Injection made incrementally with aspirations every 5 mL.  Performed by: Personally  Anesthesiologist: Marcene Duos, MD

## 2020-06-14 NOTE — Anesthesia Preprocedure Evaluation (Addendum)
Anesthesia Evaluation  Patient identified by MRN, date of birth, ID band Patient awake    Reviewed: Allergy & Precautions, NPO status , Patient's Chart, lab work & pertinent test results  Airway Mallampati: II  TM Distance: >3 FB Neck ROM: Full    Dental  (+) Dental Advisory Given   Pulmonary former smoker,    breath sounds clear to auscultation       Cardiovascular hypertension,  Rhythm:Regular Rate:Normal     Neuro/Psych  Headaches,  Neuromuscular disease    GI/Hepatic Neg liver ROS, GERD  ,  Endo/Other  negative endocrine ROS  Renal/GU negative Renal ROS     Musculoskeletal   Abdominal   Peds  Hematology negative hematology ROS (+)   Anesthesia Other Findings   Reproductive/Obstetrics                             Anesthesia Physical Anesthesia Plan  ASA: II  Anesthesia Plan: Regional and MAC   Post-op Pain Management:    Induction:   PONV Risk Score and Plan: 2 and Propofol infusion, Ondansetron and Treatment may vary due to age or medical condition  Airway Management Planned: Natural Airway and Simple Face Mask  Additional Equipment:   Intra-op Plan:   Post-operative Plan:   Informed Consent: I have reviewed the patients History and Physical, chart, labs and discussed the procedure including the risks, benefits and alternatives for the proposed anesthesia with the patient or authorized representative who has indicated his/her understanding and acceptance.       Plan Discussed with: CRNA  Anesthesia Plan Comments:         Anesthesia Quick Evaluation

## 2020-06-14 NOTE — Op Note (Signed)
PREOPERATIVE DIAGNOSIS: Right volar carpal ganglion  POSTOPERATIVE DIAGNOSIS: Same  ATTENDING PHYSICIAN: Maudry Mayhew. Jeannie Fend, III, MD who was present and scrubbed for the entire case   ASSISTANT SURGEON: None.   ANESTHESIA: Regional with MAC  SURGICAL PROCEDURES: Excision of right volar carpal ganglion  SURGICAL INDICATIONS: Patient is a 34 year old female who was seen and evaluated by me in clinic. She had injury at work in which she had acute onset right wrist pain. Following the injury she had a mass developed along the volar and radial aspect of the wrist. On evaluation in clinic this was consistent with a volar carpal ganglion that was causing her symptomatic pain. We discussed treatment options and she did wish to proceed forward with surgical excision of the lesion. She presents today for that.  FINDING: There was a well described mass along the volar and radial aspect of the wrist. It was directly overlying the FCR tendon with stalk extending down to the radiocarpal joint. Successful excision of the lesion was performed. This had bloody mucinous fluid within it.  DESCRIPTION OF PROCEDURE: Patient was identified in preop holding area where the risk benefits and alternatives of the procedure were once again discussed with patient. These risks include but are not limited to infection, bleeding, damage to surrounding structures including blood vessels and nerves, pain, stiffness, recurrence and need for additional procedures. Informed consent was obtained that time the patient's right wrist was marked with a surgical marking pen. She had underwent a right upper extremity plexus block by anesthesia. She was brought to the operative suite where timeout was performed identifying the correct patient operative site. She was positioned supine on the operative table with her hand outstretched on a hand table. She was induced under MAC sedation. Preoperative antibiotics were administered. A tourniquet was  placed on the upper arm and the right upper extremity was then prepped and draped in usual sterile fashion. The limb was exsanguinated and the tourniquet was inflated.  A ulnarly based triangular shaped incision was then made over the volar and radial aspect of the wrist. This was centered around the volar wrist flexion crease. Blunt dissection was carried down through the subcutaneous tissues. And getting deep, down towards the FCR tendon there was a well-defined cystic lesion within the soft tissues. Circumferential dissection was performed surrounding the mass. In doing so it was ruptured and there was expression of bloody, mucinous fluid. A sample of the mass was excised and sent to pathology for definitive specimen. Continued dissection around the lesion was performed and the stalk was identified as it coursed ulnar to the FCR tendon down to the radiocarpal joint. This was identified and followed down to the joint capsule. The stalk was removed and the capsule was cauterized. Rondure was gently used to debride the area of further inflamed and cystic material. At this point the wound was copiously irrigated with normal saline. The skin was closed with interrupted 4-0 Prolene sutures. Xeroform, 4 x 4's and a well-padded volar slab splint were placed. The tourniquet was released and patient had return of brisk capillary refill to all of her digits. She was awoken from her sedation and taken to the PACU in stable condition. She tolerated the procedure well and there were no complications.  RADIOGRAPHIC INTERPRETATION: None  ESTIMATED BLOOD LOSS: 10 mL  TOURNIQUET TIME: 15 minutes  SPECIMENS: Right wrist mass  POSTOPERATIVE PLAN: The patient will be discharged home and seen back  in the office in approximately 10-12 days for wound  check, suture  removal, and then be sent to a therapist for volar wrist splint fabrication and gentle range of motion exercises.  IMPLANTS: None

## 2020-06-14 NOTE — Transfer of Care (Signed)
Immediate Anesthesia Transfer of Care Note  Patient: Beth Holt  Procedure(s) Performed: Excision of right volar carpal ganglion and surgery as indicated (Right Wrist)  Patient Location: PACU  Anesthesia Type:MAC and Regional  Level of Consciousness: awake, alert  and oriented  Airway & Oxygen Therapy: Patient Spontanous Breathing and Patient connected to nasal cannula oxygen  Post-op Assessment: Report given to RN and Post -op Vital signs reviewed and stable  Post vital signs: Reviewed and stable  Last Vitals:  Vitals Value Taken Time  BP    Temp    Pulse 78 06/14/20 0944  Resp 18 06/14/20 0943  SpO2 99 % 06/14/20 0944  Vitals shown include unvalidated device data.  Last Pain:  Vitals:   06/14/20 0747  TempSrc: Oral  PainSc: 4          Complications: No complications documented.

## 2020-06-15 ENCOUNTER — Encounter (HOSPITAL_BASED_OUTPATIENT_CLINIC_OR_DEPARTMENT_OTHER): Payer: Self-pay | Admitting: Orthopaedic Surgery

## 2020-06-15 LAB — SURGICAL PATHOLOGY

## 2020-06-24 ENCOUNTER — Ambulatory Visit: Payer: 59 | Admitting: Psychiatry

## 2020-07-01 ENCOUNTER — Encounter: Payer: Self-pay | Admitting: Physical Medicine and Rehabilitation

## 2020-08-03 ENCOUNTER — Ambulatory Visit: Payer: 59 | Attending: Internal Medicine

## 2020-08-03 DIAGNOSIS — Z23 Encounter for immunization: Secondary | ICD-10-CM

## 2020-08-03 NOTE — Progress Notes (Signed)
   Covid-19 Vaccination Clinic  Name:  Beth Holt    MRN: [id number] DOB: Jan 19, 1986  08/03/2020  [name] was observed post Covid-19 immunization for 15 minutes without incident. She was provided with Vaccine Information Sheet and instruction to access the V-Safe system.   [name] was instructed to call 911 with any severe reactions post vaccine: Marland Kitchen Difficulty breathing  . Swelling of face and throat  . A fast heartbeat  . A bad rash all over body  . Dizziness and weakness   Immunizations Administered    Name Date Dose VIS Date Route   Pfizer COVID-19 Vaccine 08/03/2020  2:38 PM 0.3 mL 06/02/2020 Intramuscular   Manufacturer: [employer], [employer]   Lot: [id number]   NDC: [id number]

## 2020-08-12 ENCOUNTER — Other Ambulatory Visit: Payer: Self-pay

## 2020-08-12 ENCOUNTER — Encounter (HOSPITAL_COMMUNITY): Payer: Self-pay

## 2020-08-12 ENCOUNTER — Emergency Department (HOSPITAL_COMMUNITY)
Admission: EM | Admit: 2020-08-12 | Discharge: 2020-08-13 | Disposition: A | Discharge status: Home / Self Care | Payer: 59 | Attending: Emergency Medicine | Admitting: Emergency Medicine

## 2020-08-12 DIAGNOSIS — R600 Localized edema: Secondary | ICD-10-CM | POA: Insufficient documentation

## 2020-08-12 DIAGNOSIS — R062 Wheezing: Secondary | ICD-10-CM | POA: Insufficient documentation

## 2020-08-12 DIAGNOSIS — M7989 Other specified soft tissue disorders: Secondary | ICD-10-CM | POA: Insufficient documentation

## 2020-08-12 DIAGNOSIS — R0789 Other chest pain: Secondary | ICD-10-CM | POA: Insufficient documentation

## 2020-08-12 DIAGNOSIS — Z5321 Procedure and treatment not carried out due to patient leaving prior to being seen by health care provider: Secondary | ICD-10-CM | POA: Insufficient documentation

## 2020-08-12 LAB — CBC
HCT: 34.9 % — ABNORMAL LOW (ref 36.0–46.0)
Hemoglobin: 11.4 g/dL — ABNORMAL LOW (ref 12.0–15.0)
MCH: 28.8 pg (ref 26.0–34.0)
MCHC: 32.7 g/dL (ref 30.0–36.0)
MCV: 88.1 fL (ref 80.0–100.0)
Platelets: 282 10*3/uL (ref 150–400)
RBC: 3.96 MIL/uL (ref 3.87–5.11)
RDW: 11.8 % (ref 11.5–15.5)
WBC: 5.2 10*3/uL (ref 4.0–10.5)
nRBC: 0 % (ref 0.0–0.2)

## 2020-08-12 LAB — I-STAT BETA HCG BLOOD, ED (MC, WL, AP ONLY): I-stat hCG, quantitative: <5 m[IU]/mL (ref <5)

## 2020-08-12 NOTE — ED Triage Notes (Signed)
Pt arrives to ED via POV, called by her PCP today & told to go to UC to evaluate new-onset swelling in face, chest tightness/difficulty breathing with slight wheezing, & swelling in feet/ankles, unable to be seen due to pt load at [facility]. Pt reports being on celebrex, gabapentin, dilaudid, zoloft & ambien. Pt reports steroid injection in lower spine/sacral area approx 1.5wks ago as tx for sciatica

## 2020-08-13 DIAGNOSIS — M7989 Other specified soft tissue disorders: Secondary | ICD-10-CM | POA: Diagnosis not present

## 2020-08-13 LAB — TROPONIN I (HIGH SENSITIVITY)
Troponin I (High Sensitivity): 4 ng/L (ref <18)
Troponin I (High Sensitivity): 3 ng/L (ref <18)

## 2020-08-13 LAB — BASIC METABOLIC PANEL
Anion gap: 9 (ref 5–15)
BUN: 13 mg/dL (ref 6–20)
CO2: 26 mmol/L (ref 22–32)
Calcium: 8.9 mg/dL (ref 8.9–10.3)
Chloride: 104 mmol/L (ref 98–111)
Creatinine, Ser: 0.63 mg/dL (ref 0.44–1.00)
GFR, Estimated: >60 mL/min (ref >60)
Glucose, Bld: 92 mg/dL (ref 70–99)
Potassium: 3.6 mmol/L (ref 3.5–5.1)
Sodium: 139 mmol/L (ref 135–145)

## 2020-08-13 NOTE — ED Notes (Signed)
Pt stated she would be sitting in the car thats parked outside in the front.

## 2020-08-13 NOTE — ED Notes (Signed)
Pt leaving

## 2020-08-13 NOTE — ED Notes (Signed)
Pt called for vitals x3 no response 

## 2020-08-17 ENCOUNTER — Encounter: Payer: 59 | Admitting: Physical Medicine and Rehabilitation

## 2020-09-14 ENCOUNTER — Ambulatory Visit: Payer: 59 | Admitting: Psychiatry

## 2020-09-24 ENCOUNTER — Encounter: Payer: 59 | Attending: Physical Medicine and Rehabilitation | Admitting: Physical Medicine and Rehabilitation

## 2023-05-04 LAB — AMB RESULTS CONSOLE CBG: Glucose: 119

## 2024-04-24 ENCOUNTER — Ambulatory Visit (INDEPENDENT_AMBULATORY_CARE_PROVIDER_SITE_OTHER): Admitting: Dermatology

## 2024-04-24 ENCOUNTER — Encounter: Payer: Self-pay | Admitting: Dermatology

## 2024-04-24 VITALS — BP 125/74 | HR 81

## 2024-04-24 DIAGNOSIS — D2371 Other benign neoplasm of skin of right lower limb, including hip: Secondary | ICD-10-CM

## 2024-04-24 DIAGNOSIS — D485 Neoplasm of uncertain behavior of skin: Secondary | ICD-10-CM | POA: Diagnosis not present

## 2024-04-24 NOTE — Progress Notes (Unsigned)
   New Patient Visit   Subjective  Beth Holt is a 38 y.o. female who presents for the following: growth. Lesion on right lateral upper thigh, present for several years. Not growing or changing. Gets irritated with shaving.   Pt has no hx of skin cancer. Family hx of unknown skin cancer  The following portions of the chart were reviewed this encounter and updated as appropriate: medications, allergies, medical history  Review of Systems:  No other skin or systemic complaints except as noted in HPI or Assessment and Plan.  Objective  Well appearing patient in no apparent distress; mood and affect are within normal limits.  A focused examination was performed of the following areas: Right thigh  Relevant exam findings are noted in the Assessment and Plan.  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 Treatment Plan: A dermatofibroma is a benign growth possibly related to trauma, such as an insect bite, cut from shaving, or inflamed acne-type bump.  Treatment options to remove include shave or excision with resulting scar and risk of recurrence.  Since benign-appearing and not bothersome, will observe for now.    NEOPLASM OF UNCERTAIN BEHAVIOR OF SKIN Right Thigh Skin / nail biopsy Type of biopsy: tangential   Informed consent: discussed and consent obtained   Timeout: patient name, date of birth, surgical site, and procedure verified   Procedure prep:  Patient was prepped and draped in usual sterile fashion Prep type:  Isopropyl alcohol Anesthesia: the lesion was anesthetized in a standard fashion   Anesthetic:  1% lidocaine  w/ epinephrine  1-100,000 buffered w/ 8.4% NaHCO3 Instrument used: DermaBlade   Hemostasis achieved with: aluminum chloride   Outcome: patient tolerated procedure well   Post-procedure details: sterile dressing applied and wound care instructions  given   Dressing type: bandage and pressure dressing    Specimen 1 - Surgical pathology Differential Diagnosis: R/O DF vs other  Check Margins: No  Return if symptoms worsen or fail to improve.  I, Darice Smock, CMA, am acting as scribe for RUFUS CHRISTELLA HOLY, MD.   Documentation: I have reviewed the above documentation for accuracy and completeness, and I agree with the above.  RUFUS CHRISTELLA HOLY, MD

## 2024-04-24 NOTE — Patient Instructions (Addendum)

## 2024-04-25 LAB — SURGICAL PATHOLOGY

## 2024-04-28 ENCOUNTER — Ambulatory Visit: Payer: Self-pay | Admitting: Dermatology

## 2024-08-26 NOTE — Progress Notes (Unsigned)
 "  Office Visit Note  Patient: Beth Holt             Date of Birth: 02/29/1986           MRN: [id number]             PCP: Kip Beverley SQUIBB, MD (Inactive) Referring: Kip Beverley, MD Visit Date: 09/01/2024 Occupation: Data Unavailable  Subjective:  No chief complaint on file.   History of Present Illness: Beth Holt is a 39 y.o. female ***     Activities of Daily Living:  Patient reports morning stiffness for *** {minute/hour:19697}.   Patient {ACTIONS;DENIES/REPORTS:21021675::Denies} nocturnal pain.  Difficulty dressing/grooming: {ACTIONS;DENIES/REPORTS:21021675::Denies} Difficulty climbing stairs: {ACTIONS;DENIES/REPORTS:21021675::Denies} Difficulty getting out of chair: {ACTIONS;DENIES/REPORTS:21021675::Denies} Difficulty using hands for taps, buttons, cutlery, and/or writing: {ACTIONS;DENIES/REPORTS:21021675::Denies}  No Rheumatology ROS completed.   PMFS History:  Patient Active Problem List   Diagnosis Date Noted   Multiple neurological symptoms 08/08/2017   Bloating 10/28/2012   Diarrhea 10/28/2012   GERD (gastroesophageal reflux disease) 10/28/2012    Past Medical History:  Diagnosis Date   ADD (attention deficit disorder)    ADHD    Anxiety    Chronic back pain    GERD (gastroesophageal reflux disease)    Hypertension    Lumbar radiculitis    Migraines    Multiple neurological symptoms    PTSD (post-traumatic stress disorder)     Family History  Problem Relation Age of Onset   Breast cancer Mother    Colon cancer Neg Hx    Multiple sclerosis Neg Hx    Past Surgical History:  Procedure Laterality Date   BACTERIAL OVERGROWTH TEST N/A 11/22/2012   Procedure: BACTERIAL OVERGROWTH TEST;  Surgeon: Lamar CHRISTELLA Hollingshead, MD;  Location: [facility];  Service: Endoscopy;  Laterality: N/A;  7:30   COLONOSCOPY WITH ESOPHAGOGASTRODUODENOSCOPY (EGD) N/A 10/31/2012   Procedure: COLONOSCOPY WITH ESOPHAGOGASTRODUODENOSCOPY (EGD);  Surgeon: Lamar CHRISTELLA Hollingshead, MD;  Location: [facility];  Service: Endoscopy;  Laterality: N/A;  9:45   GANGLION CYST EXCISION Right 06/14/2020   Procedure: Excision of right volar carpal ganglion and surgery as indicated;  Surgeon: Carolee Lynwood JINNY DOUGLAS, MD;  Location: [facility];  Service: Orthopedics;  Laterality: Right;  block in preop   TONSILLECTOMY  2012   WISDOM TOOTH EXTRACTION     Social History[1] Social History   Social History Narrative   Not on file     Immunization History  Administered Date(s) Administered   PFIZER(Purple Top)SARS-COV-2 Vaccination 11/15/2019, 12/09/2019, 08/03/2020     Objective: Vital Signs: There were no vitals taken for this visit.   Physical Exam   Musculoskeletal Exam: ***  CDAI Exam: CDAI Score: -- Patient Global: --; Provider Global: -- Swollen: --; Tender: -- Joint Exam 09/01/2024   No joint exam has been documented for this visit   There is currently no information documented on the homunculus. Go to the Rheumatology activity and complete the homunculus joint exam.  Investigation: No additional findings.  Imaging: No results found.  Recent Labs: Lab Results  Component Value Date   WBC 5.2 08/12/2020   HGB 11.4 (L) 08/12/2020   PLT 282 08/12/2020   NA 139 08/12/2020   K 3.6 08/12/2020   CL 104 08/12/2020   CO2 26 08/12/2020   GLUCOSE 92 08/12/2020   BUN 13 08/12/2020   CREATININE 0.63 08/12/2020   BILITOT 0.3 09/08/2013   ALKPHOS 63 09/08/2013   AST 14 09/08/2013   ALT 11 09/08/2013  PROT 7.1 09/08/2013   ALBUMIN 3.3 (L) 09/08/2013   CALCIUM 8.9 08/12/2020   GFRAA >90 09/08/2013    Speciality Comments: No specialty comments available.  Procedures:  No procedures performed Allergies: Lithium, Aspirin, Lactose intolerance (gi), Latex, Morphine and codeine, and Tylenol [acetaminophen]   Assessment / Plan:     Visit Diagnoses: No diagnosis found.  Orders: No orders of the defined types were placed in this  encounter.  No orders of the defined types were placed in this encounter.   Face-to-face time spent with patient was *** minutes. Greater than 50% of time was spent in counseling and coordination of care.  Follow-Up Instructions: No follow-ups on file.   Alfonso Patterson, LPN  Note - This record has been created using [employer].  Chart creation errors have been sought, but may not always  have been located. Such creation errors do not reflect on  the standard of medical care.    [1]  Social History Tobacco Use   Smoking status: Former    Current packs/day: 0.50    Average packs/day: 0.5 packs/day for 5.0 years (2.5 ttl pk-yrs)    Types: Cigarettes   Smokeless tobacco: Never  Vaping Use   Vaping status: Some Days  Substance Use Topics   Alcohol use: No   Drug use: No   "

## 2024-09-01 ENCOUNTER — Encounter

## 2024-11-11 ENCOUNTER — Ambulatory Visit
